# Patient Record
Sex: Female | Born: 1960 | Race: Black or African American | Hispanic: No | Marital: Single | Smoking: Current every day smoker
Health system: Southern US, Community
[De-identification: ages and names within clinical notes are randomized; demographics above are authoritative.]

## PROBLEM LIST (undated history)

## (undated) DIAGNOSIS — E785 Hyperlipidemia, unspecified: Secondary | ICD-10-CM

## (undated) DIAGNOSIS — I10 Essential (primary) hypertension: Secondary | ICD-10-CM

## (undated) DIAGNOSIS — E119 Type 2 diabetes mellitus without complications: Secondary | ICD-10-CM

## (undated) DIAGNOSIS — F419 Anxiety disorder, unspecified: Secondary | ICD-10-CM

## (undated) DIAGNOSIS — E079 Disorder of thyroid, unspecified: Secondary | ICD-10-CM

## (undated) HISTORY — DX: Type 2 diabetes mellitus without complications: E11.9

## (undated) HISTORY — PX: CARPAL TUNNEL RELEASE: SHX101

## (undated) HISTORY — DX: Hyperlipidemia, unspecified: E78.5

## (undated) HISTORY — PX: ROTATOR CUFF REPAIR: SHX139

## (undated) HISTORY — PX: ABDOMINAL HYSTERECTOMY: SHX81

---

## 2006-03-28 ENCOUNTER — Emergency Department (HOSPITAL_COMMUNITY): Admission: EM | Admit: 2006-03-28 | Discharge: 2006-03-29 | Payer: Self-pay | Admitting: Emergency Medicine

## 2013-02-16 ENCOUNTER — Emergency Department (HOSPITAL_BASED_OUTPATIENT_CLINIC_OR_DEPARTMENT_OTHER): Payer: Self-pay

## 2013-02-16 ENCOUNTER — Emergency Department (HOSPITAL_BASED_OUTPATIENT_CLINIC_OR_DEPARTMENT_OTHER)
Admission: EM | Admit: 2013-02-16 | Discharge: 2013-02-17 | Disposition: A | Discharge status: Home / Self Care | Payer: Self-pay | Attending: Emergency Medicine | Admitting: Emergency Medicine

## 2013-02-16 ENCOUNTER — Encounter (HOSPITAL_BASED_OUTPATIENT_CLINIC_OR_DEPARTMENT_OTHER): Payer: Self-pay | Admitting: Emergency Medicine

## 2013-02-16 DIAGNOSIS — Z79899 Other long term (current) drug therapy: Secondary | ICD-10-CM | POA: Insufficient documentation

## 2013-02-16 DIAGNOSIS — E079 Disorder of thyroid, unspecified: Secondary | ICD-10-CM | POA: Insufficient documentation

## 2013-02-16 DIAGNOSIS — I10 Essential (primary) hypertension: Secondary | ICD-10-CM | POA: Insufficient documentation

## 2013-02-16 DIAGNOSIS — F411 Generalized anxiety disorder: Secondary | ICD-10-CM | POA: Insufficient documentation

## 2013-02-16 DIAGNOSIS — J40 Bronchitis, not specified as acute or chronic: Secondary | ICD-10-CM

## 2013-02-16 DIAGNOSIS — J209 Acute bronchitis, unspecified: Secondary | ICD-10-CM | POA: Insufficient documentation

## 2013-02-16 HISTORY — DX: Anxiety disorder, unspecified: F41.9

## 2013-02-16 HISTORY — DX: Essential (primary) hypertension: I10

## 2013-02-16 HISTORY — DX: Disorder of thyroid, unspecified: E07.9

## 2013-02-16 NOTE — ED Notes (Signed)
Pt reports cough x 1 week and SOB. Seen by PMD for same last tues given PCN and taking OTC cough med SOB increased today along with continued cough

## 2013-02-16 NOTE — ED Provider Notes (Signed)
CSN: [id number]     Arrival date & time 02/16/13  2046 History   First MD Initiated Contact with Patient 02/16/13 2220     Chief Complaint  Patient presents with  . Cough  . Shortness of Breath   (Consider location/radiation/quality/duration/timing/severity/associated sxs/prior Treatment) Patient is a 52 y.o. female presenting with cough. The history is provided by the patient. No language interpreter was used.  Cough Cough characteristics:  Productive Sputum characteristics:  Nondescript Severity:  Moderate Onset quality:  Gradual Duration:  1 week Timing:  Constant Progression:  Worsening Chronicity:  New Smoker: no   Relieved by:  Nothing Worsened by:  Nothing tried Ineffective treatments:  None tried Associated symptoms: shortness of breath   Associated symptoms: no chest pain and no fever   Pt complains of a cough and congestion.  Pt reports coughing up colored phelgm  Past Medical History  Diagnosis Date  . Hypertension   . Thyroid disease   . Anxiety    No past surgical history on file. No family history on file. History  Substance Use Topics  . Smoking status: Never Smoker   . Smokeless tobacco: Not on file  . Alcohol Use: No   OB History   Grav Para Term Preterm Abortions TAB SAB Ect Mult Living                 Review of Systems  Constitutional: Negative for fever.  Respiratory: Positive for cough and shortness of breath.   Cardiovascular: Negative for chest pain.  All other systems reviewed and are negative.    Allergies  Review of patient's allergies indicates no known allergies.  Home Medications   Current Outpatient Rx  Name  Route  Sig  Dispense  Refill  . alprazolam (XANAX) 2 MG tablet   Oral   Take 2 mg by mouth at bedtime as needed for sleep.         Marland Kitchen levothyroxine (SYNTHROID, LEVOTHROID) 75 MCG tablet   Oral   Take 75 mcg by mouth daily before breakfast.         . lisinopril (PRINIVIL,ZESTRIL) 10 MG tablet   Oral   Take 10  mg by mouth daily.          BP 156/85  Pulse 91  Temp(Src) 98.7 F (37.1 C) (Oral)  Resp 18  SpO2 97% Physical Exam  Nursing note and vitals reviewed. Constitutional: She appears well-developed and well-nourished.  HENT:  Head: Normocephalic.  Right Ear: External ear normal.  Left Ear: External ear normal.  Eyes: Conjunctivae are normal. Pupils are equal, round, and reactive to light.  Neck: Normal range of motion. Neck supple.  Cardiovascular: Normal rate and normal heart sounds.   Pulmonary/Chest: Effort normal.  Abdominal: Soft. Bowel sounds are normal.  Musculoskeletal: Normal range of motion.  Neurological: She is alert.  Skin: Skin is warm.  Psychiatric: She has a normal mood and affect.    ED Course  Procedures (including critical care time) Labs Review Labs Reviewed - No data to display Imaging Review Dg Chest 2 View  02/16/2013   CLINICAL DATA:  Cough, shortness of breath.  EXAM: CHEST  2 VIEW  COMPARISON:  None.  FINDINGS: The heart size and mediastinal contours are within normal limits. Both lungs are clear. The visualized skeletal structures are unremarkable.  IMPRESSION: No active cardiopulmonary disease.   Electronically Signed   By: Charlett Nose M.D.   On: 02/16/2013 23:54    MDM   1. Bronchitis  Rx for zithromax and tussionex.   Pt advised to see her Md for recheck in 3-4 days  Elson Areas, [place] 02/17/13 0011

## 2013-02-17 MED ORDER — AZITHROMYCIN 250 MG PO TABS: 250.0000 mg | ORAL_TABLET | Freq: Every day | ORAL | Status: DC

## 2013-02-17 MED ORDER — HYDROCODONE-HOMATROPINE 5-1.5 MG/5ML PO SYRP: 5.0000 mL | ORAL_SOLUTION | Freq: Four times a day (QID) | ORAL | Status: DC | PRN

## 2013-02-17 NOTE — ED Provider Notes (Signed)
Medical screening examination/treatment/procedure(s) were performed by non-physician practitioner and as supervising physician I was immediately available for consultation/collaboration.   Dagmar Hait, MD 02/17/13 1725

## 2013-10-20 ENCOUNTER — Emergency Department (HOSPITAL_BASED_OUTPATIENT_CLINIC_OR_DEPARTMENT_OTHER): Payer: Self-pay

## 2013-10-20 ENCOUNTER — Encounter (HOSPITAL_BASED_OUTPATIENT_CLINIC_OR_DEPARTMENT_OTHER): Payer: Self-pay | Admitting: Emergency Medicine

## 2013-10-20 ENCOUNTER — Emergency Department (HOSPITAL_BASED_OUTPATIENT_CLINIC_OR_DEPARTMENT_OTHER)
Admission: EM | Admit: 2013-10-20 | Discharge: 2013-10-20 | Disposition: A | Discharge status: Home / Self Care | Payer: Self-pay | Attending: Emergency Medicine | Admitting: Emergency Medicine

## 2013-10-20 DIAGNOSIS — R059 Cough, unspecified: Secondary | ICD-10-CM | POA: Insufficient documentation

## 2013-10-20 DIAGNOSIS — R109 Unspecified abdominal pain: Secondary | ICD-10-CM | POA: Insufficient documentation

## 2013-10-20 DIAGNOSIS — R52 Pain, unspecified: Secondary | ICD-10-CM | POA: Insufficient documentation

## 2013-10-20 DIAGNOSIS — R5383 Other fatigue: Secondary | ICD-10-CM

## 2013-10-20 DIAGNOSIS — Z79899 Other long term (current) drug therapy: Secondary | ICD-10-CM | POA: Insufficient documentation

## 2013-10-20 DIAGNOSIS — R5381 Other malaise: Secondary | ICD-10-CM | POA: Insufficient documentation

## 2013-10-20 DIAGNOSIS — J3489 Other specified disorders of nose and nasal sinuses: Secondary | ICD-10-CM | POA: Insufficient documentation

## 2013-10-20 DIAGNOSIS — E079 Disorder of thyroid, unspecified: Secondary | ICD-10-CM | POA: Insufficient documentation

## 2013-10-20 DIAGNOSIS — I10 Essential (primary) hypertension: Secondary | ICD-10-CM | POA: Insufficient documentation

## 2013-10-20 DIAGNOSIS — Z792 Long term (current) use of antibiotics: Secondary | ICD-10-CM | POA: Insufficient documentation

## 2013-10-20 DIAGNOSIS — R51 Headache: Secondary | ICD-10-CM | POA: Insufficient documentation

## 2013-10-20 DIAGNOSIS — R519 Headache, unspecified: Secondary | ICD-10-CM

## 2013-10-20 DIAGNOSIS — R002 Palpitations: Secondary | ICD-10-CM | POA: Insufficient documentation

## 2013-10-20 DIAGNOSIS — R05 Cough: Secondary | ICD-10-CM | POA: Insufficient documentation

## 2013-10-20 DIAGNOSIS — F411 Generalized anxiety disorder: Secondary | ICD-10-CM | POA: Insufficient documentation

## 2013-10-20 LAB — COMPREHENSIVE METABOLIC PANEL
ALT: 29 U/L (ref 0–35)
AST: 23 U/L (ref 0–37)
Albumin: 4.1 g/dL (ref 3.5–5.2)
Alkaline Phosphatase: 61 U/L (ref 39–117)
BUN: 15 mg/dL (ref 6–23)
CO2: 32 mEq/L (ref 19–32)
Calcium: 10.3 mg/dL (ref 8.4–10.5)
Chloride: 97 mEq/L (ref 96–112)
Creatinine, Ser: 0.8 mg/dL (ref 0.50–1.10)
GFR calc Af Amer: >90 mL/min (ref >90)
GFR calc non Af Amer: 85 mL/min — ABNORMAL LOW (ref >90)
Glucose, Bld: 103 mg/dL — ABNORMAL HIGH (ref 70–99)
Potassium: 3.3 mEq/L — ABNORMAL LOW (ref 3.7–5.3)
Sodium: 142 mEq/L (ref 137–147)
Total Bilirubin: 0.3 mg/dL (ref 0.3–1.2)
Total Protein: 7.5 g/dL (ref 6.0–8.3)

## 2013-10-20 LAB — URINALYSIS, ROUTINE W REFLEX MICROSCOPIC
Bilirubin Urine: NEGATIVE
GLUCOSE, UA: NEGATIVE mg/dL
KETONES UR: NEGATIVE mg/dL
LEUKOCYTES UA: NEGATIVE
Nitrite: NEGATIVE
PROTEIN: NEGATIVE mg/dL
Specific Gravity, Urine: 1.025 (ref 1.005–1.030)
Urobilinogen, UA: 1 mg/dL (ref 0.0–1.0)
pH: 6 (ref 5.0–8.0)

## 2013-10-20 LAB — CBC WITH DIFFERENTIAL/PLATELET
Basophils Absolute: 0 10*3/uL (ref 0.0–0.1)
Basophils Relative: 1 % (ref 0–1)
Eosinophils Absolute: 0.1 10*3/uL (ref 0.0–0.7)
Eosinophils Relative: 1 % (ref 0–5)
HCT: 41.4 % (ref 36.0–46.0)
Hemoglobin: 14.3 g/dL (ref 12.0–15.0)
Lymphocytes Relative: 48 % — ABNORMAL HIGH (ref 12–46)
Lymphs Abs: 2.9 10*3/uL (ref 0.7–4.0)
MCH: 31.9 pg (ref 26.0–34.0)
MCHC: 34.5 g/dL (ref 30.0–36.0)
MCV: 92.4 fL (ref 78.0–100.0)
Monocytes Absolute: 0.5 10*3/uL (ref 0.1–1.0)
Monocytes Relative: 8 % (ref 3–12)
Neutro Abs: 2.5 10*3/uL (ref 1.7–7.7)
Neutrophils Relative %: 42 % — ABNORMAL LOW (ref 43–77)
Platelets: 221 10*3/uL (ref 150–400)
RBC: 4.48 MIL/uL (ref 3.87–5.11)
RDW: 12.8 % (ref 11.5–15.5)
WBC: 6 10*3/uL (ref 4.0–10.5)

## 2013-10-20 LAB — TROPONIN I: Troponin I: <0.3 ng/mL (ref <0.30)

## 2013-10-20 LAB — URINE MICROSCOPIC-ADD ON

## 2013-10-20 LAB — I-STAT CG4 LACTIC ACID, ED: Lactic Acid, Venous: 1.03 mmol/L (ref 0.5–2.2)

## 2013-10-20 LAB — LIPASE, BLOOD: Lipase: 22 U/L (ref 11–59)

## 2013-10-20 LAB — TSH: TSH: 0.02 u[IU]/mL — ABNORMAL LOW (ref 0.350–4.500)

## 2013-10-20 MED ORDER — OXYCODONE-ACETAMINOPHEN 5-325 MG PO TABS: 2.0000 | ORAL_TABLET | ORAL | Status: DC | PRN

## 2013-10-20 MED ORDER — METOCLOPRAMIDE HCL 5 MG/ML IJ SOLN
10.0000 mg | Freq: Once | INTRAMUSCULAR | Status: AC
  Administered 2013-10-20: 10 mg via INTRAVENOUS
  Filled 2013-10-20: qty 2

## 2013-10-20 MED ORDER — KETOROLAC TROMETHAMINE 30 MG/ML IJ SOLN
30.0000 mg | Freq: Once | INTRAMUSCULAR | Status: AC
  Administered 2013-10-20: 30 mg via INTRAVENOUS
  Filled 2013-10-20: qty 1

## 2013-10-20 MED ORDER — HYDROMORPHONE HCL PF 1 MG/ML IJ SOLN
1.0000 mg | Freq: Once | INTRAMUSCULAR | Status: AC
  Administered 2013-10-20: 1 mg via INTRAVENOUS
  Filled 2013-10-20: qty 1

## 2013-10-20 MED ORDER — ONDANSETRON 8 MG PO TBDP: ORAL_TABLET | ORAL | Status: DC

## 2013-10-20 MED ORDER — METOCLOPRAMIDE HCL 10 MG PO TABS: 10.0000 mg | ORAL_TABLET | Freq: Four times a day (QID) | ORAL | Status: DC | PRN

## 2013-10-20 MED ORDER — DIPHENHYDRAMINE HCL 50 MG/ML IJ SOLN
25.0000 mg | Freq: Once | INTRAMUSCULAR | Status: AC
  Administered 2013-10-20: 25 mg via INTRAVENOUS
  Filled 2013-10-20: qty 1

## 2013-10-20 NOTE — ED Provider Notes (Signed)
CSN: [id number]     Arrival date & time 10/20/13  1514 History   First MD Initiated Contact with Patient 10/20/13 1525     Chief Complaint  Patient presents with  . Headache  . Palpitations     (Consider location/radiation/quality/duration/timing/severity/associated sxs/prior Treatment) HPI One week cough nasal congestion body aches general fatigue and daily spells mild pressure headache gradual onset lasts several hours [place] cold helps; no neuro Sxs; feels generally weak fatigue last week, twice daily cold sweats for 15 minutes each, palpitations twice first lasted several hours felt like racing 5 days ago did not check pulse, occurred at rest; 4 days ago had similar spell; still has headaches daily so to ED since keeps recurring. Thyroid checked OK 2 months ago. 2 days ago noticed mild lower abdominal pain only when cough. No N/V/D dysuria. No CP/SOB.  Past Medical History  Diagnosis Date  . Hypertension   . Thyroid disease   . Anxiety    Past Surgical History  Procedure Laterality Date  . Abdominal hysterectomy     No family history on file. History  Substance Use Topics  . Smoking status: Never Smoker   . Smokeless tobacco: Never Used  . Alcohol Use: No   OB History   Grav Para Term Preterm Abortions TAB SAB Ect Mult Living                 Review of Systems 10 Systems reviewed and are negative for acute change except as noted in the HPI.   Allergies  Review of patient's allergies indicates no known allergies.  Home Medications   Prior to Admission medications   Medication Sig Start Date End Date Taking? Authorizing Provider  alprazolam Prudy Feeler(XANAX) 2 MG tablet Take 2 mg by mouth at bedtime as needed for sleep.   Yes Historical Provider, MD  levothyroxine (SYNTHROID, LEVOTHROID) 75 MCG tablet Take 75 mcg by mouth daily before breakfast.   Yes Historical Provider, MD  lisinopril (PRINIVIL,ZESTRIL) 10 MG tablet Take 10 mg by mouth daily.   Yes Historical Provider,  MD  azithromycin (ZITHROMAX) 250 MG tablet Take 1 tablet (250 mg total) by mouth daily. Take first 2 tablets together, then 1 every day until finished. 02/17/13   Elson AreasLeslie K Sofia, PA-C  HYDROcodone-homatropine [facility](HYCODAN) 5-1.5 MG/5ML syrup Take 5 mLs by mouth every 6 (six) hours as needed for cough. 02/17/13   Elson AreasLeslie K Sofia, PA-C   BP 105/51  Pulse 70  Temp(Src) 98.8 F (37.1 C) (Oral)  Resp 13  SpO2 98% Physical Exam  Nursing note and vitals reviewed. Constitutional:  Awake, alert, nontoxic appearance with baseline speech for patient.  HENT:  Head: Atraumatic.  Mouth/Throat: No oropharyngeal exudate.  Eyes: EOM are normal. Pupils are equal, round, and reactive to light. Right eye exhibits no discharge. Left eye exhibits no discharge.  Neck: Neck supple.  Cardiovascular: Normal rate and regular rhythm.   No murmur heard. Pulmonary/Chest: Effort normal and breath sounds normal. No stridor. No respiratory distress. She has no wheezes. She has no rales. She exhibits no tenderness.  Abdominal: Soft. Bowel sounds are normal. She exhibits no mass. There is tenderness. There is no rebound.  Minimal suprapubic tenderness  Musculoskeletal: She exhibits no tenderness.  Baseline ROM, moves extremities with no obvious new focal weakness.  Lymphadenopathy:    She has no cervical adenopathy.  Neurological: She is alert.  Awake, alert, cooperative and aware of situation; motor strength bilaterally; sensation normal to light touch bilaterally; peripheral visual  fields full to confrontation; no facial asymmetry; tongue midline; major cranial nerves appear intact; no pronator drift, normal finger to nose bilaterally, baseline gait without new ataxia.  Skin: No rash noted.  Psychiatric: She has a normal mood and affect.    ED Course  Procedures (including critical care time) Patient / Family / Caregiver informed of clinical course, understand medical decision-making process, and agree with plan.Suspect  viral syndrome. Labs Review Labs Reviewed  CBC WITH DIFFERENTIAL - Abnormal; Notable for the following:    Neutrophils Relative % 42 (*)    Lymphocytes Relative 48 (*)    All other components within normal limits  COMPREHENSIVE METABOLIC PANEL - Abnormal; Notable for the following:    Potassium 3.3 (*)    Glucose, Bld 103 (*)    GFR calc non Af Amer 85 (*)    All other components within normal limits  URINALYSIS, ROUTINE W REFLEX MICROSCOPIC - Abnormal; Notable for the following:    Hgb urine dipstick SMALL (*)    All other components within normal limits  TSH - Abnormal; Notable for the following:    TSH 0.020 (*)    All other components within normal limits  URINE MICROSCOPIC-ADD ON - Abnormal; Notable for the following:    Squamous Epithelial / LPF FEW (*)    Bacteria, UA FEW (*)    All other components within normal limits  LIPASE, BLOOD  TROPONIN I  I-STAT CG4 LACTIC ACID, ED    Imaging Review Dg Chest 2 View  10/20/2013   CLINICAL DATA:  Palpitations.  EXAM: CHEST  2 VIEW  COMPARISON:  None.  FINDINGS: The heart size and mediastinal contours are within normal limits. Both lungs are clear. The visualized skeletal structures are unremarkable.  IMPRESSION: No active cardiopulmonary disease.   Electronically Signed   By: Signa Kellaylor  Stroud M.D.   On: 10/20/2013 16:13     EKG Interpretation   Date/Time:  Sunday Oct 20 2013 15:27:04 EDT Ventricular Rate:  96 PR Interval:  146 QRS Duration: 84 QT Interval:  436 QTC Calculation: 550 R Axis:   -71 Text Interpretation:  Normal sinus rhythm Left axis deviation Possible  Anterolateral infarct , age undetermined No previous ECGs available  Confirmed by [facility] Ltd[facility]EDNAR  MD, Jonny RuizJOHN ([id number]) on 10/20/2013 3:43:35 PM      MDM   Final diagnoses:  Headache  Palpitations    I doubt any other EMC precluding discharge at this time including, but not necessarily limited to the following:sepsis, AMI, SAH, CVA, meningitis.    Hurman HornJohn M Kennett Symes,  MD 10/22/13 [phone]

## 2013-10-20 NOTE — ED Notes (Signed)
Reports headache since Monday and palpitations since wednesday

## 2013-10-20 NOTE — Discharge Instructions (Signed)
You are having a headache. No specific cause was found today for your headache. It may have been a migraine or other cause of headache. Stress, anxiety, fatigue, and depression are common triggers for headaches. Your headache today does not appear to be life-threatening or require hospitalization, but often the exact cause of headaches is not determined in the emergency department. Therefore, follow-up with your doctor is very important to find out what may have caused your headache, and whether or not you need any further diagnostic testing or treatment. Sometimes headaches can appear benign (not harmful), but then more serious symptoms can develop which should prompt an immediate re-evaluation by your doctor or the emergency department. °SEEK MEDICAL ATTENTION IF: °You develop possible problems with medications prescribed.  °The medications don't resolve your headache, if it recurs , or if you have multiple episodes of vomiting or can't take fluids. °You have a change from the usual headache. °RETURN IMMEDIATELY IF you develop a sudden, severe headache or confusion, become poorly responsive or faint, develop a fever above 100.4F or problem breathing, have a change in speech, vision, swallowing, or understanding, or develop new weakness, numbness, tingling, incoordination, or have a seizure. ° °Not every illness or injury can be identified during an emergency department visit, thus follow-up with your primary healthcare provider is important. Medical conditions can also worsen, so it is also important to return immediately as directed below, or if you have other serious concerns develop. °RETURN IMMEDIATELY IF you develop new shortness of breath, chest pain, fever, have difficulty moving parts of your body (new weakness, numbness, or incoordination), sudden change in speech, vision, swallowing, or understanding, faint or develop new dizziness, severe headache, become poorly responsive or have an altered mental  status compared to baseline for you, new rash, abdominal pain, or bloody stools,  °Return sooner also if you develop new problems for which you have not talked to your caregiver but you feel may be emergency medical conditions, or are unable to be cared for safely at home. °

## 2014-07-19 ENCOUNTER — Emergency Department (HOSPITAL_BASED_OUTPATIENT_CLINIC_OR_DEPARTMENT_OTHER): Payer: Self-pay

## 2014-07-19 ENCOUNTER — Encounter (HOSPITAL_BASED_OUTPATIENT_CLINIC_OR_DEPARTMENT_OTHER): Payer: Self-pay | Admitting: *Deleted

## 2014-07-19 ENCOUNTER — Emergency Department (HOSPITAL_BASED_OUTPATIENT_CLINIC_OR_DEPARTMENT_OTHER)
Admission: EM | Admit: 2014-07-19 | Discharge: 2014-07-19 | Disposition: A | Discharge status: Home / Self Care | Payer: Self-pay | Attending: Emergency Medicine | Admitting: Emergency Medicine

## 2014-07-19 DIAGNOSIS — T464X5A Adverse effect of angiotensin-converting-enzyme inhibitors, initial encounter: Secondary | ICD-10-CM | POA: Insufficient documentation

## 2014-07-19 DIAGNOSIS — Z79899 Other long term (current) drug therapy: Secondary | ICD-10-CM | POA: Insufficient documentation

## 2014-07-19 DIAGNOSIS — I10 Essential (primary) hypertension: Secondary | ICD-10-CM | POA: Insufficient documentation

## 2014-07-19 DIAGNOSIS — F419 Anxiety disorder, unspecified: Secondary | ICD-10-CM | POA: Insufficient documentation

## 2014-07-19 DIAGNOSIS — Z792 Long term (current) use of antibiotics: Secondary | ICD-10-CM | POA: Insufficient documentation

## 2014-07-19 DIAGNOSIS — E079 Disorder of thyroid, unspecified: Secondary | ICD-10-CM | POA: Insufficient documentation

## 2014-07-19 DIAGNOSIS — R05 Cough: Secondary | ICD-10-CM | POA: Insufficient documentation

## 2014-07-19 DIAGNOSIS — R059 Cough, unspecified: Secondary | ICD-10-CM

## 2014-07-19 MED ORDER — BENZONATATE 100 MG PO CAPS: 100.0000 mg | ORAL_CAPSULE | Freq: Three times a day (TID) | ORAL | Status: DC

## 2014-07-19 NOTE — ED Provider Notes (Signed)
CSN: [id number]     Arrival date & time 07/19/14  1421 History   First MD Initiated Contact with Patient 07/19/14 1422     Chief Complaint  Patient presents with  . Cough     (Consider location/radiation/quality/duration/timing/severity/associated sxs/prior Treatment) HPI Comments: 54 year old female with high blood pressure and thyroid history presents with recurrent cough and mild congestion nonproductive for the past month. Patient initially denied taking lisinopril however it's on her home medications. Patient said no fevers or chills no heart failure history mild worsening at night. No significant weight gain or leg swelling. No reflux history known.   Patient is a 54 y.o. female presenting with cough. The history is provided by the patient.  Cough Associated symptoms: no chest pain, no chills, no fever, no headaches, no rash and no shortness of breath     Past Medical History  Diagnosis Date  . Hypertension   . Thyroid disease   . Anxiety    Past Surgical History  Procedure Laterality Date  . Abdominal hysterectomy     No family history on file. History  Substance Use Topics  . Smoking status: Passive Smoke Exposure - Never Smoker  . Smokeless tobacco: Never Used  . Alcohol Use: No   OB History    No data available     Review of Systems  Constitutional: Negative for fever and chills.  HENT: Negative for congestion.   Eyes: Negative for visual disturbance.  Respiratory: Positive for cough. Negative for shortness of breath.   Cardiovascular: Negative for chest pain.  Gastrointestinal: Negative for vomiting and abdominal pain.  Genitourinary: Negative for dysuria and flank pain.  Musculoskeletal: Negative for back pain, neck pain and neck stiffness.  Skin: Negative for rash.  Neurological: Negative for light-headedness and headaches.      Allergies  Review of patient's allergies indicates no known allergies.  Home Medications   Prior to Admission  medications   Medication Sig Start Date End Date Taking? Authorizing Provider  alprazolam Prudy Feeler(XANAX) 2 MG tablet Take 2 mg by mouth at bedtime as needed for sleep.   Yes Historical Provider, MD  levothyroxine (SYNTHROID, LEVOTHROID) 75 MCG tablet Take 75 mcg by mouth daily before breakfast.   Yes Historical Provider, MD  lisinopril (PRINIVIL,ZESTRIL) 10 MG tablet Take 10 mg by mouth daily.   Yes Historical Provider, MD  azithromycin (ZITHROMAX) 250 MG tablet Take 1 tablet (250 mg total) by mouth daily. Take first 2 tablets together, then 1 every day until finished. 02/17/13   Elson AreasLeslie K Sofia, PA-C  HYDROcodone-homatropine [facility](HYCODAN) 5-1.5 MG/5ML syrup Take 5 mLs by mouth every 6 (six) hours as needed for cough. 02/17/13   Elson AreasLeslie K Sofia, PA-C  metoCLOPramide (REGLAN) 10 MG tablet Take 1 tablet (10 mg total) by mouth every 6 (six) hours as needed for nausea (nausea/headache). 10/20/13   Hurman HornJohn M Bednar, MD  ondansetron (ZOFRAN ODT) 8 MG disintegrating tablet 8mg  ODT q4 hours prn nausea 10/20/13   Hurman HornJohn M Bednar, MD  oxyCODONE-acetaminophen (PERCOCET) 5-325 MG per tablet Take 2 tablets by mouth every 4 (four) hours as needed. 10/20/13   Hurman HornJohn M Bednar, MD   BP 139/93 mmHg  Pulse 89  Temp(Src) 99.2 F (37.3 C) (Oral)  Resp 20  Ht 5\' 4"  (1.626 m)  SpO2 100% Physical Exam  Constitutional: She is oriented to person, place, and time. She appears well-developed and well-nourished.  HENT:  Head: Normocephalic and atraumatic.  Eyes: Conjunctivae are normal. Right eye exhibits no discharge. Left  eye exhibits no discharge.  Neck: Normal range of motion. Neck supple. No tracheal deviation present.  Cardiovascular: Normal rate and regular rhythm.   Pulmonary/Chest: Effort normal and breath sounds normal.  Musculoskeletal: She exhibits no edema.  Neurological: She is alert and oriented to person, place, and time.  Skin: Skin is warm. No rash noted.  Psychiatric: She has a normal mood and affect.  Nursing note and  vitals reviewed.   ED Course  Procedures (including critical care time) Labs Review Labs Reviewed - No data to display  Imaging Review No results found.   EKG Interpretation None      MDM   Final diagnoses:  Cough   Patient presents with recurrent cough likely lisinopril-induced however may be viral with the seasoning congestion. Discussed holding lisinopril, honey for cough and follow-up outpatient. No angioedema on exam. Signed out to fup Xray. Results and differential diagnosis were discussed with the patient/parent/guardian. Close follow up outpatient was discussed, comfortable with the plan.   Medications - No data to display  Filed Vitals:   07/19/14 1428  BP: 139/93  Pulse: 89  Temp: 99.2 F (37.3 C)  TempSrc: Oral  Resp: 20  Height:  (1.626 m)  SpO2: 100%    Final diagnoses:  Cough       Enid Skeens, MD 07/19/14 1515

## 2014-07-19 NOTE — ED Notes (Signed)
Pt reports cough over 1 month- congested but non-productive

## 2014-07-19 NOTE — Discharge Instructions (Signed)
Stop taking and avoid lisinopril as it side effect is chronic cough. Check your Blood Pressures at home daily, and discuss with your doctor.  Discuss other medications with hyourdoctor. Try tea and honey for your cough.   If you were given medicines take as directed.  If you are on coumadin or contraceptives realize their levels and effectiveness is altered by many different medicines.  If you have any reaction (rash, tongues swelling, other) to the medicines stop taking and see a physician.   Please follow up as directed and return to the ER or see a physician for new or worsening symptoms.  Thank you. Filed Vitals:   07/19/14 1428  BP: 139/93  Pulse: 89  Temp: 99.2 F (37.3 C)  TempSrc: Oral  Resp: 20  Height: 5\' 4"  (1.626 m)  SpO2: 100%

## 2014-08-03 ENCOUNTER — Emergency Department (HOSPITAL_BASED_OUTPATIENT_CLINIC_OR_DEPARTMENT_OTHER)
Admission: EM | Admit: 2014-08-03 | Discharge: 2014-08-03 | Disposition: A | Discharge status: Home / Self Care | Payer: Self-pay | Attending: Emergency Medicine | Admitting: Emergency Medicine

## 2014-08-03 ENCOUNTER — Encounter (HOSPITAL_BASED_OUTPATIENT_CLINIC_OR_DEPARTMENT_OTHER): Payer: Self-pay

## 2014-08-03 DIAGNOSIS — F32A Depression, unspecified: Secondary | ICD-10-CM

## 2014-08-03 DIAGNOSIS — E079 Disorder of thyroid, unspecified: Secondary | ICD-10-CM | POA: Insufficient documentation

## 2014-08-03 DIAGNOSIS — Z79899 Other long term (current) drug therapy: Secondary | ICD-10-CM | POA: Insufficient documentation

## 2014-08-03 DIAGNOSIS — F329 Major depressive disorder, single episode, unspecified: Secondary | ICD-10-CM | POA: Insufficient documentation

## 2014-08-03 DIAGNOSIS — F419 Anxiety disorder, unspecified: Secondary | ICD-10-CM | POA: Insufficient documentation

## 2014-08-03 DIAGNOSIS — Z792 Long term (current) use of antibiotics: Secondary | ICD-10-CM | POA: Insufficient documentation

## 2014-08-03 DIAGNOSIS — I10 Essential (primary) hypertension: Secondary | ICD-10-CM | POA: Insufficient documentation

## 2014-08-03 LAB — CBC WITH DIFFERENTIAL/PLATELET
Basophils Absolute: 0 10*3/uL (ref 0.0–0.1)
Basophils Relative: 0 % (ref 0–1)
Eosinophils Absolute: 0 10*3/uL (ref 0.0–0.7)
Eosinophils Relative: 0 % (ref 0–5)
HCT: 43.4 % (ref 36.0–46.0)
Hemoglobin: 14.4 g/dL (ref 12.0–15.0)
Lymphocytes Relative: 33 % (ref 12–46)
Lymphs Abs: 3 10*3/uL (ref 0.7–4.0)
MCH: 30.9 pg (ref 26.0–34.0)
MCHC: 33.2 g/dL (ref 30.0–36.0)
MCV: 93.1 fL (ref 78.0–100.0)
Monocytes Absolute: 0.6 10*3/uL (ref 0.1–1.0)
Monocytes Relative: 7 % (ref 3–12)
Neutro Abs: 5.5 10*3/uL (ref 1.7–7.7)
Neutrophils Relative %: 60 % (ref 43–77)
Platelets: 253 10*3/uL (ref 150–400)
RBC: 4.66 MIL/uL (ref 3.87–5.11)
RDW: 13.7 % (ref 11.5–15.5)
WBC: 9.2 10*3/uL (ref 4.0–10.5)

## 2014-08-03 LAB — URINALYSIS, ROUTINE W REFLEX MICROSCOPIC
Bilirubin Urine: NEGATIVE
GLUCOSE, UA: NEGATIVE mg/dL
KETONES UR: NEGATIVE mg/dL
Leukocytes, UA: NEGATIVE
Nitrite: NEGATIVE
PH: 5 (ref 5.0–8.0)
Protein, ur: NEGATIVE mg/dL
Specific Gravity, Urine: 1.008 (ref 1.005–1.030)
Urobilinogen, UA: 0.2 mg/dL (ref 0.0–1.0)

## 2014-08-03 LAB — RAPID URINE DRUG SCREEN, HOSP PERFORMED
AMPHETAMINES: NOT DETECTED
BARBITURATES: NOT DETECTED
BENZODIAZEPINES: POSITIVE — AB
COCAINE: NOT DETECTED
OPIATES: NOT DETECTED
Tetrahydrocannabinol: NOT DETECTED

## 2014-08-03 LAB — BASIC METABOLIC PANEL
Anion gap: 3 — ABNORMAL LOW (ref 5–15)
BUN: 18 mg/dL (ref 6–23)
CHLORIDE: 109 mmol/L (ref 96–112)
CO2: 24 mmol/L (ref 19–32)
Calcium: 8.9 mg/dL (ref 8.4–10.5)
Creatinine, Ser: 0.9 mg/dL (ref 0.50–1.10)
GFR calc non Af Amer: 73 mL/min — ABNORMAL LOW (ref >90)
GFR, EST AFRICAN AMERICAN: 84 mL/min — AB (ref >90)
Glucose, Bld: 115 mg/dL — ABNORMAL HIGH (ref 70–99)
POTASSIUM: 3.8 mmol/L (ref 3.5–5.1)
SODIUM: 136 mmol/L (ref 135–145)

## 2014-08-03 LAB — URINE MICROSCOPIC-ADD ON

## 2014-08-03 LAB — ETHANOL: Alcohol, Ethyl (B): <5 mg/dL (ref 0–9)

## 2014-08-03 MED ORDER — ALPRAZOLAM 0.5 MG PO TABS
2.0000 mg | ORAL_TABLET | Freq: Once | ORAL | Status: AC
  Administered 2014-08-03: 2 mg via ORAL
  Filled 2014-08-03: qty 4

## 2014-08-03 MED ORDER — ALPRAZOLAM 2 MG PO TABS: 2.0000 mg | ORAL_TABLET | Freq: Every evening | ORAL | Status: DC | PRN

## 2014-08-03 NOTE — [facility] Counselor (Signed)
Spoke to PA about pt disposition and she agreed with recommendations. Counselor sent over outpatient referrals for therapy and pt is to follow up with outpatient doctor for medication adjustment.   Kateri PlummerKristin Arvilla Salada, M.S., LPCA, [place]AS[place], [facility] Licensed Professional Counselor Associate  Triage Specialist  [facility]  Therapeutic Triage Services Phone: [phone] Fax: [phone]

## 2014-08-03 NOTE — ED Provider Notes (Signed)
CSN: [id number]     Arrival date & time 08/03/14  0820 History   First MD Initiated Contact with Patient 08/03/14 [phone]     Chief Complaint  Patient presents with  . Medication Refill     (Consider location/radiation/quality/duration/timing/severity/associated sxs/prior Treatment) HPI Comments: Patient with a history of anxiety and depression presents with depression and suicidal ideations. She states that about 2 weeks ago her primary care physician took her off of her Xanax which she been taking regularly and switch her to Klonopin. She states since she's been on the Klonopin her anxiety has been worse and she's been more depressed. She's having thoughts of wanting to hurt herself. On exam she is tearful and says that she doesn't care if she lives or dies. She denies any specific plan. She feels weak all over and has a little bit of dizziness but denies any other physical complaints. She denies any recent fevers chills. No cough congestion. No chest pain or shortness of breath. No abdominal pain.   Past Medical History  Diagnosis Date  . Hypertension   . Thyroid disease   . Anxiety    Past Surgical History  Procedure Laterality Date  . Abdominal hysterectomy     No family history on file. History  Substance Use Topics  . Smoking status: Passive Smoke Exposure - Never Smoker  . Smokeless tobacco: Never Used  . Alcohol Use: No   OB History    No data available     Review of Systems  Constitutional: Positive for fatigue. Negative for fever, chills and diaphoresis.  HENT: Negative for congestion, rhinorrhea and sneezing.   Eyes: Negative.   Respiratory: Negative for cough, chest tightness and shortness of breath.   Cardiovascular: Negative for chest pain and leg swelling.  Gastrointestinal: Negative for nausea, vomiting, abdominal pain, diarrhea and blood in stool.  Genitourinary: Negative for frequency, hematuria, flank pain and difficulty urinating.  Musculoskeletal: Negative  for back pain and arthralgias.  Skin: Negative for rash.  Neurological: Positive for dizziness and light-headedness. Negative for speech difficulty, weakness, numbness and headaches.  Psychiatric/Behavioral: Positive for suicidal ideas and dysphoric mood. The patient is nervous/anxious.       Allergies  Review of patient's allergies indicates no known allergies.  Home Medications   Prior to Admission medications   Medication Sig Start Date End Date Taking? Authorizing Provider  sertraline (ZOLOFT) 100 MG tablet Take 100 mg by mouth daily.   Yes Historical Provider, MD  alprazolam Prudy Feeler(XANAX) 2 MG tablet Take 1 tablet (2 mg total) by mouth at bedtime as needed for sleep. 08/03/14   Rolan BuccoMelanie Rajiv Parlato, MD  azithromycin (ZITHROMAX) 250 MG tablet Take 1 tablet (250 mg total) by mouth daily. Take first 2 tablets together, then 1 every day until finished. 02/17/13   Elson AreasLeslie K Sofia, PA-C  benzonatate (TESSALON) 100 MG capsule Take 1 capsule (100 mg total) by mouth every 8 (eight) hours. 07/19/14   Rolland PorterMark James, MD  HYDROcodone-homatropine [facility](HYCODAN) 5-1.5 MG/5ML syrup Take 5 mLs by mouth every 6 (six) hours as needed for cough. 02/17/13   Elson AreasLeslie K Sofia, PA-C  levothyroxine (SYNTHROID, LEVOTHROID) 75 MCG tablet Take 75 mcg by mouth daily before breakfast.    Historical Provider, MD  lisinopril (PRINIVIL,ZESTRIL) 10 MG tablet Take 10 mg by mouth daily.    Historical Provider, MD  metoCLOPramide (REGLAN) 10 MG tablet Take 1 tablet (10 mg total) by mouth every 6 (six) hours as needed for nausea (nausea/headache). 10/20/13   Margit BandaJohn M  Bednar, MD  ondansetron (ZOFRAN ODT) 8 MG disintegrating tablet  ODT q4 hours prn nausea 10/20/13   Hurman Horn, MD  oxyCODONE-acetaminophen (PERCOCET) 5-325 MG per tablet Take 2 tablets by mouth every 4 (four) hours as needed. 10/20/13   Hurman Horn, MD   BP 153/107 mmHg  Pulse 95  Temp(Src) 99.1 F (37.3 C) (Oral)  Resp 19  Ht  (1.626 m)  Wt 215 lb (97.523 kg)  BMI 36.89  kg/m2  SpO2 99% Physical Exam  Constitutional: She is oriented to person, place, and time. She appears well-developed and well-nourished.  HENT:  Head: Normocephalic and atraumatic.  Eyes: Pupils are equal, round, and reactive to light.  Neck: Normal range of motion. Neck supple.  Cardiovascular: Normal rate, regular rhythm and normal heart sounds.   Pulmonary/Chest: Effort normal and breath sounds normal. No respiratory distress. She has no wheezes. She has no rales. She exhibits no tenderness.  Abdominal: Soft. Bowel sounds are normal. There is no tenderness. There is no rebound and no guarding.  Musculoskeletal: Normal range of motion. She exhibits no edema.  Lymphadenopathy:    She has no cervical adenopathy.  Neurological: She is alert and oriented to person, place, and time.  Skin: Skin is warm and dry. No rash noted.  Psychiatric: She has a normal mood and affect.    ED Course  Procedures (including critical care time) Labs Review Labs Reviewed  BASIC METABOLIC PANEL - Abnormal; Notable for the following:    Glucose, Bld 115 (*)    GFR calc non Af Amer 73 (*)    GFR calc Af Amer 84 (*)    Anion gap 3 (*)    All other components within normal limits  URINALYSIS, ROUTINE W REFLEX MICROSCOPIC - Abnormal; Notable for the following:    Hgb urine dipstick SMALL (*)    All other components within normal limits  URINE RAPID DRUG SCREEN (HOSP PERFORMED) - Abnormal; Notable for the following:    Benzodiazepines POSITIVE (*)    All other components within normal limits  CBC WITH DIFFERENTIAL/PLATELET  ETHANOL  URINE MICROSCOPIC-ADD ON    Imaging Review No results found.   EKG Interpretation None      MDM   Final diagnoses:  Anxiety  Depression    Patient was assessed by TTS and felt that she was safe for outpatient treatment. She currently is denying any suicidal ideations. I did give her a prescription for a few pills of Xanax which she feels helps her much better  than the Klonopin. She's going follow-up this week with her primary care physician Dr. August Saucer. She also is given list of outpatient resources for behavioral health follow-up.    Rolan Bucco, MD 08/03/14 1236

## 2014-08-03 NOTE — ED Notes (Signed)
Patient here wanting medications switched back. Has been taking daily xanax for years and switched to zoloft the past 10 days. Patient reports that after she read about the side effects of the new medication she thinks she has the reaction with extremity weakness. She wants her xanax to be refilled and to stop the zoloft

## 2014-08-03 NOTE — Discharge Instructions (Signed)

## 2014-08-03 NOTE — ED Notes (Signed)
Patient here requesting that her medication be changed back to xanax. Reports that she was taking 2mg  of xanax and they cut her to 1mg  and now switched to zoloft. Thinks it isnt helping her nerves, reports that she needs her old meds. No other complaints

## 2014-08-03 NOTE — ED Notes (Signed)
telepsych in process. 

## 2014-08-03 NOTE — ED Notes (Addendum)
Dr Fredderick PhenixBelfi speaking to Behavioral health- Pt updated on plan for discharge

## 2014-08-03 NOTE — ED Notes (Signed)
D/c home with family- resources given for f/u outpatient with Behavioral Health - rx x 1 given for xanax- denies SI/HI

## 2014-08-03 NOTE — BH Assessment (Addendum)
Tele Assessment Note   Phyllis HughsBertha Erpelding is an 54 y.o. female who [facility] with issues surrounding recent medication changes. She states she has been taking xanex 2mg  tabs for 20 years and her doctor recently took her off of this and put her on Colonopin. She states that she has had muscle weakness and tingling in her arms and legs, mood swings, and dizziness since she started this medication. She states that she doesn't want to take it anymore and wants to be back on her xanex. She states that she has been having some passive SI thoughts to "just go" but has no plan or intent. She has no prior hospitalizations or prior outpatient therapy history. Pt reports she has been taking prozac for depression. No HI or A/V present, states she just wants to "get her nerve pill and go to church with her grandbaby". Some tearfulness upon assessment but brightened with approach. Also states that she is having a hard time sleeping and only gets 3 hours of sleep a night. No changes in appetite.   Disposition: recommended outpatient follow up with her doctor. Counselor will provide outpatient resources for therapy as well. - per Serena ColonelAggie Nwoko NP   Axis I: 300.02 Generalized Anxiety Disorder Axis II: Deferred Axis III:  Past Medical History  Diagnosis Date  . Hypertension   . Thyroid disease   . Anxiety    Axis IV: other psychosocial or environmental problems Axis V: 51-60 moderate symptoms  Past Medical History:  Past Medical History  Diagnosis Date  . Hypertension   . Thyroid disease   . Anxiety     Past Surgical History  Procedure Laterality Date  . Abdominal hysterectomy      Family History: No family history on file.  Social History:  reports that she has been passively smoking.  She has never used smokeless tobacco. She reports that she does not drink alcohol or use illicit drugs.  Additional Social History:  Alcohol / Drug Use History of alcohol / drug use?: No history of  alcohol / drug abuse  CIWA: CIWA-Ar BP: (!) 145/102 mmHg Pulse Rate: 109 COWS:    PATIENT STRENGTHS: (choose at least two) General fund of knowledge Supportive family/friends  Allergies: No Known Allergies  Home Medications:  (Not in a hospital admission)  OB/GYN Status:  No LMP recorded. Patient has had a hysterectomy.  General Assessment Data Location of Assessment: [facility] Services [facility] Is this a Tele or Face-to-Face Assessment?: Tele Assessment Is this an Initial Assessment or a Re-assessment for this encounter?: Initial Assessment Living Arrangements: Alone Can pt return to current living arrangement?: Yes Admission Status: Voluntary Is patient capable of signing voluntary admission?: Yes Transfer from: Home Referral Source: Self/Family/Friend     [facility] Living Arrangements: Alone Name of Psychiatrist:  (Dr. August Saucerean) Name of Therapist: None  Education Status Is patient currently in school?: No Highest grade of school patient has completed: some colelge  Risk to self with the past 6 months Suicidal Ideation: No (some passive SI thoughts no plan) Suicidal Intent: No Is patient at risk for suicide?: No Suicidal Plan?: No Access to Means: No What has been your use of drugs/alcohol within the last 12 months?:  (None) Previous Attempts/Gestures: No How many times?: 0 Other Self Harm Risks: none Triggers for Past Attempts: None known Intentional Self Injurious Behavior: None Family Suicide History: No Recent stressful life event(s): Other (Comment) (reaction to new medication ) Persecutory voices/beliefs?: No  Depression: Yes Depression Symptoms: Tearfulness, Loss of interest in usual pleasures Substance abuse history and/or treatment for substance abuse?: No Suicide prevention information given to non-admitted patients: Not applicable  Risk to Others within the past 6 months Homicidal Ideation: No Thoughts of Harm to Others: No Current  Homicidal Intent: No Current Homicidal Plan: No Access to Homicidal Means: No Identified Victim:  (None) History of harm to others?: No Assessment of Violence: None Noted Violent Behavior Description: none Does patient have access to weapons?: No Criminal Charges Pending?: No Does patient have a court date: No  Psychosis Hallucinations: None noted Delusions: None noted  Mental Status Report Appear/Hygiene: Unremarkable Eye Contact: Good Motor Activity: Freedom of movement Speech: Logical/coherent Level of Consciousness: Alert Mood: Anxious Affect: Appropriate to circumstance Anxiety Level: Moderate Thought Processes: Coherent Judgement: Unimpaired Orientation: Person, Place, Time, Situation Obsessive Compulsive Thoughts/Behaviors: None  Cognitive Functioning Concentration: Decreased Memory: Recent Intact, Remote Intact IQ: Average Insight: Fair Impulse Control: Good Appetite: Good Weight Loss: 0 Weight Gain: 0 Sleep: Decreased Total Hours of Sleep: 3 Vegetative Symptoms: None  ADLScreening [facility] Assessment Services) Patient's cognitive ability adequate to safely complete daily activities?: Yes Patient able to express need for assistance with ADLs?: Yes Independently performs ADLs?: Yes (appropriate for developmental age)  Prior Inpatient Therapy Prior Inpatient Therapy: No  Prior Outpatient Therapy Prior Outpatient Therapy: No  ADL Screening (condition at time of admission) Patient's cognitive ability adequate to safely complete daily activities?: Yes Is the patient deaf or have difficulty hearing?: No Does the patient have difficulty seeing, even when wearing glasses/contacts?: No Does the patient have difficulty concentrating, remembering, or making decisions?: No Patient able to express need for assistance with ADLs?: Yes Does the patient have difficulty dressing or bathing?: No Independently performs ADLs?: Yes (appropriate for developmental age) Does  the patient have difficulty walking or climbing stairs?: No Weakness of Legs: None Weakness of Arms/Hands: None  Home Assistive Devices/Equipment Home Assistive Devices/Equipment: None    Abuse/Neglect Assessment (Assessment to be complete while patient is alone) Physical Abuse: Denies Verbal Abuse: Denies Sexual Abuse: Denies Exploitation of patient/patient's resources: Denies Self-Neglect: Denies Values / Beliefs Cultural Requests During Hospitalization: None Spiritual Requests During Hospitalization: None Consults Spiritual Care Consult Needed: No Advance Directives (For Healthcare) Does patient have an advance directive?: No Would patient like information on creating an advanced directive?: No - patient declined information    Additional Information 1:1 In Past 12 Months?: No CIRT Risk: No Elopement Risk: No Does patient have medical clearance?: Yes     Disposition:  Disposition Initial Assessment Completed for this Encounter: Yes Disposition of Patient: Other dispositions Other disposition(s): Other (Comment)  Jael Waldorf 08/03/2014 10:36 AM

## 2014-11-11 DIAGNOSIS — M25519 Pain in unspecified shoulder: Secondary | ICD-10-CM | POA: Insufficient documentation

## 2014-12-24 ENCOUNTER — Emergency Department (HOSPITAL_BASED_OUTPATIENT_CLINIC_OR_DEPARTMENT_OTHER): Payer: Self-pay

## 2014-12-24 ENCOUNTER — Encounter (HOSPITAL_BASED_OUTPATIENT_CLINIC_OR_DEPARTMENT_OTHER): Payer: Self-pay | Admitting: *Deleted

## 2014-12-24 ENCOUNTER — Emergency Department (HOSPITAL_BASED_OUTPATIENT_CLINIC_OR_DEPARTMENT_OTHER)
Admission: EM | Admit: 2014-12-24 | Discharge: 2014-12-25 | Disposition: A | Discharge status: Home / Self Care | Payer: Self-pay | Attending: Emergency Medicine | Admitting: Emergency Medicine

## 2014-12-24 DIAGNOSIS — F419 Anxiety disorder, unspecified: Secondary | ICD-10-CM | POA: Insufficient documentation

## 2014-12-24 DIAGNOSIS — Z79899 Other long term (current) drug therapy: Secondary | ICD-10-CM | POA: Insufficient documentation

## 2014-12-24 DIAGNOSIS — M7581 Other shoulder lesions, right shoulder: Secondary | ICD-10-CM | POA: Insufficient documentation

## 2014-12-24 DIAGNOSIS — M779 Enthesopathy, unspecified: Secondary | ICD-10-CM

## 2014-12-24 DIAGNOSIS — E079 Disorder of thyroid, unspecified: Secondary | ICD-10-CM | POA: Insufficient documentation

## 2014-12-24 DIAGNOSIS — R52 Pain, unspecified: Secondary | ICD-10-CM

## 2014-12-24 DIAGNOSIS — I10 Essential (primary) hypertension: Secondary | ICD-10-CM | POA: Insufficient documentation

## 2014-12-24 MED ORDER — METHOCARBAMOL 500 MG PO TABS
1000.0000 mg | ORAL_TABLET | Freq: Three times a day (TID) | ORAL | Status: DC
  Administered 2014-12-24: 1000 mg via ORAL
  Filled 2014-12-24: qty 2

## 2014-12-24 MED ORDER — KETOROLAC TROMETHAMINE 60 MG/2ML IM SOLN
60.0000 mg | Freq: Once | INTRAMUSCULAR | Status: AC
  Administered 2014-12-24: 60 mg via INTRAMUSCULAR
  Filled 2014-12-24: qty 2

## 2014-12-24 NOTE — ED Provider Notes (Signed)
CSN: [id number]     Arrival date & time 12/24/14  2130 History  This chart was scribed for Ameah Chanda, MD by Lyndel SafeKaitlyn Shelton, ED Scribe. This patient was seen in room MH01/MH01 and the patient's care was started 11:26 PM.     Chief Complaint  Patient presents with  . Shoulder Pain    Patient is a 54 y.o. female presenting with shoulder injury. The history is provided by the patient. No language interpreter was used.  Shoulder Injury This is a new problem. The current episode started more than 1 week ago (1 month). The problem occurs constantly. The problem has been gradually worsening. Pertinent negatives include no chest pain and no shortness of breath. Exacerbated by: extension, flexion, adduction, abduction. Nothing relieves the symptoms. She has tried a warm compress and a cold compress (Advil) for the symptoms. The treatment provided no relief.   HPI Comments: Phyllis Moore is a 54 y.o. female who presents to the Emergency Department complaining of gradually worsening, constant, moderate, right shoulder pain that began 1 month ago. She reports the pain is exacerbated with all movements. She has been taken advil and applied hot and cold compresses with no relief. She denies any injury, activity, or lifting attributable to her right shoulder pain. Additionally denies the use of steroids.    Past Medical History  Diagnosis Date  . Hypertension   . Thyroid disease   . Anxiety    Past Surgical History  Procedure Laterality Date  . Abdominal hysterectomy     History reviewed. No pertinent family history. History  Substance Use Topics  . Smoking status: Passive Smoke Exposure - Never Smoker  . Smokeless tobacco: Never Used  . Alcohol Use: No   OB History    No data available     Review of Systems  Respiratory: Negative for cough, choking, chest tightness and shortness of breath.   Cardiovascular: Negative for chest pain, palpitations and leg swelling.  Musculoskeletal:  Positive for arthralgias.  All other systems reviewed and are negative.   Allergies  Review of patient's allergies indicates no known allergies.  Home Medications   Prior to Admission medications   Medication Sig Start Date End Date Taking? Authorizing Provider  alprazolam Prudy Feeler(XANAX) 2 MG tablet Take 1 tablet (2 mg total) by mouth at bedtime as needed for sleep. 08/03/14   Rolan BuccoMelanie Belfi, MD  azithromycin (ZITHROMAX) 250 MG tablet Take 1 tablet (250 mg total) by mouth daily. Take first 2 tablets together, then 1 every day until finished. 02/17/13   Elson AreasLeslie K Sofia, PA-C  benzonatate (TESSALON) 100 MG capsule Take 1 capsule (100 mg total) by mouth every 8 (eight) hours. 07/19/14   Rolland PorterMark James, MD  HYDROcodone-homatropine [facility](HYCODAN) 5-1.5 MG/5ML syrup Take 5 mLs by mouth every 6 (six) hours as needed for cough. 02/17/13   Elson AreasLeslie K Sofia, PA-C  levothyroxine (SYNTHROID, LEVOTHROID) 75 MCG tablet Take 75 mcg by mouth daily before breakfast.    Historical Provider, MD  lisinopril (PRINIVIL,ZESTRIL) 10 MG tablet Take 10 mg by mouth daily.    Historical Provider, MD  metoCLOPramide (REGLAN) 10 MG tablet Take 1 tablet (10 mg total) by mouth every 6 (six) hours as needed for nausea (nausea/headache). 10/20/13   Wayland SalinasJohn Bednar, MD  ondansetron (ZOFRAN ODT) 8 MG disintegrating tablet 8mg  ODT q4 hours prn nausea 10/20/13   Wayland SalinasJohn Bednar, MD  oxyCODONE-acetaminophen (PERCOCET) 5-325 MG per tablet Take 2 tablets by mouth every 4 (four) hours as needed. 10/20/13   Jonny RuizJohn  Bednar, MD  sertraline (ZOLOFT) 100 MG tablet Take 100 mg by mouth daily.    Historical Provider, MD   BP 108/74 mmHg  Pulse 87  Temp(Src) 98.2 F (36.8 C) (Oral)  Resp 18  Ht  (1.626 m)  Wt 223 lb 4 oz (101.266 kg)  BMI 38.30 kg/m2  SpO2 98% Physical Exam  Constitutional: She appears well-developed and well-nourished. No distress.  HENT:  Head: Normocephalic and atraumatic.  Mouth/Throat: Oropharynx is clear and moist. No oropharyngeal exudate.   Eyes: Conjunctivae and EOM are normal. Pupils are equal, round, and reactive to light. Right eye exhibits no discharge. Left eye exhibits no discharge. No scleral icterus.  Neck: Neck supple. No JVD present.  Cardiovascular: Normal rate, regular rhythm and normal heart sounds.   Cap refill less than 2 seconds; 2 + radial pulse  Pulmonary/Chest: Effort normal and breath sounds normal. No respiratory distress.  Abdominal: Soft. Bowel sounds are normal. There is no tenderness. There is no rebound and no guarding.  Musculoskeletal: Normal range of motion. She exhibits no edema.       Right shoulder: She exhibits normal range of motion, no bony tenderness, no swelling, no effusion, no crepitus, no deformity, no laceration, no spasm, normal pulse and normal strength.       Right elbow: She exhibits normal range of motion, no swelling, no effusion, no deformity and no laceration. No tenderness found. No radial head, no medial epicondyle, no lateral epicondyle and no olecranon process tenderness noted.       Right wrist: Normal.       Right hand: Normal. Normal sensation noted. Normal strength noted.  Intact bicep tendon; intact triceps; no step offs or crepitus of the C-spine; negative neer's test;  Neurological: She is alert. She has normal reflexes. Coordination normal.  Skin: Skin is warm. No rash noted. No erythema. No pallor.  Psychiatric: She has a normal mood and affect. Her behavior is normal.  Nursing note and vitals reviewed.   ED Course  Procedures  DIAGNOSTIC STUDIES: Oxygen Saturation is 98% on RA, normal by my interpretation.    COORDINATION OF CARE: 11:30 PM Discussed treatment plan which includes to order injection of toradol, Robaxin and prescribe a muscle relaxant  with pt. Xray or right shoulder ordered. Pt acknowledges and agrees to plan.   Labs Review Labs Reviewed - No data to display  Imaging Review No results found.   EKG Interpretation None      MDM   Final  diagnoses:  Pain    Heat, ROM and NSAIDs and follow up with sports medicine.    I personally performed the services described in this documentation, which was scribed in my presence. The recorded information has been reviewed and is accurate.    Cy Blamer, MD 12/25/14 [phone]

## 2014-12-24 NOTE — ED Notes (Addendum)
Pt c/o right shoulder pain x 1 month  Denies injury, increased pain with movt

## 2014-12-25 ENCOUNTER — Encounter (HOSPITAL_BASED_OUTPATIENT_CLINIC_OR_DEPARTMENT_OTHER): Payer: Self-pay | Admitting: Emergency Medicine

## 2014-12-25 MED ORDER — MELOXICAM 15 MG PO TABS: 15.0000 mg | ORAL_TABLET | Freq: Every day | ORAL | Status: DC

## 2014-12-25 MED ORDER — METHOCARBAMOL 500 MG PO TABS: 500.0000 mg | ORAL_TABLET | Freq: Two times a day (BID) | ORAL | Status: DC

## 2015-01-21 ENCOUNTER — Encounter (HOSPITAL_BASED_OUTPATIENT_CLINIC_OR_DEPARTMENT_OTHER): Payer: Self-pay | Admitting: Emergency Medicine

## 2015-01-21 ENCOUNTER — Emergency Department (HOSPITAL_BASED_OUTPATIENT_CLINIC_OR_DEPARTMENT_OTHER)
Admission: EM | Admit: 2015-01-21 | Discharge: 2015-01-22 | Disposition: A | Discharge status: Home / Self Care | Payer: Self-pay | Attending: Emergency Medicine | Admitting: Emergency Medicine

## 2015-01-21 DIAGNOSIS — F419 Anxiety disorder, unspecified: Secondary | ICD-10-CM | POA: Insufficient documentation

## 2015-01-21 DIAGNOSIS — E079 Disorder of thyroid, unspecified: Secondary | ICD-10-CM | POA: Insufficient documentation

## 2015-01-21 DIAGNOSIS — Z79899 Other long term (current) drug therapy: Secondary | ICD-10-CM | POA: Insufficient documentation

## 2015-01-21 DIAGNOSIS — M25511 Pain in right shoulder: Secondary | ICD-10-CM | POA: Insufficient documentation

## 2015-01-21 DIAGNOSIS — I10 Essential (primary) hypertension: Secondary | ICD-10-CM | POA: Insufficient documentation

## 2015-01-21 DIAGNOSIS — E669 Obesity, unspecified: Secondary | ICD-10-CM | POA: Insufficient documentation

## 2015-01-21 MED ORDER — IBUPROFEN 600 MG PO TABS: 600.0000 mg | ORAL_TABLET | Freq: Four times a day (QID) | ORAL | Status: DC | PRN

## 2015-01-21 MED ORDER — CYCLOBENZAPRINE HCL 10 MG PO TABS
5.0000 mg | ORAL_TABLET | Freq: Once | ORAL | Status: AC
  Administered 2015-01-21: 5 mg via ORAL
  Filled 2015-01-21: qty 1

## 2015-01-21 MED ORDER — CYCLOBENZAPRINE HCL 10 MG PO TABS: 10.0000 mg | ORAL_TABLET | Freq: Two times a day (BID) | ORAL | Status: DC | PRN

## 2015-01-21 MED ORDER — KETOROLAC TROMETHAMINE 60 MG/2ML IM SOLN
60.0000 mg | Freq: Once | INTRAMUSCULAR | Status: AC
  Administered 2015-01-21: 60 mg via INTRAMUSCULAR
  Filled 2015-01-21: qty 2

## 2015-01-21 NOTE — Discharge Instructions (Signed)
You were seen today for shoulder pain. You may have some element of tendinitis.  Previously x-rays have been reassuring. You need to make sure that you are moving your shoulder to keep her shoulder from freezing up. Below are some exercises. You should follow-up with sports medicine.  Shoulder Exercises EXERCISES  RANGE OF MOTION (ROM) AND STRETCHING EXERCISES These exercises may help you when beginning to rehabilitate your injury. Your symptoms may resolve with or without further involvement from your physician, physical therapist or athletic trainer. While completing these exercises, remember:   Restoring tissue flexibility helps normal motion to return to the joints. This allows healthier, less painful movement and activity.  An effective stretch should be held for at least 30 seconds.  A stretch should never be painful. You should only feel a gentle lengthening or release in the stretched tissue. ROM - Pendulum  Bend at the waist so that your right / left arm falls away from your body. Support yourself with your opposite hand on a solid surface, such as a table or a countertop.  Your right / left arm should be perpendicular to the ground. If it is not perpendicular, you need to lean over farther. Relax the muscles in your right / left arm and shoulder as much as possible.  Gently sway your hips and trunk so they move your right / left arm without any use of your right / left shoulder muscles.  Progress your movements so that your right / left arm moves side to side, then forward and backward, and finally, both clockwise and counterclockwise.  Complete __________ repetitions in each direction. Many people use this exercise to relieve discomfort in their shoulder as well as to gain range of motion. Repeat __________ times. Complete this exercise __________ times per day. STRETCH - Flexion, Standing  Stand with good posture. With an underhand grip on your right / left hand and an overhand  grip on the opposite hand, grasp a broomstick or cane so that your hands are a little more than shoulder-width apart.  Keeping your right / left elbow straight and shoulder muscles relaxed, push the stick with your opposite hand to raise your right / left arm in front of your body and then overhead. Raise your arm until you feel a stretch in your right / left shoulder, but before you have increased shoulder pain.  Try to avoid shrugging your right / left shoulder as your arm rises by keeping your shoulder blade tucked down and toward your mid-back spine. Hold __________ seconds.  Slowly return to the starting position. Repeat __________ times. Complete this exercise __________ times per day. STRETCH - Internal Rotation  Place your right / left hand behind your back, palm-up.  Throw a towel or belt over your opposite shoulder. Grasp the towel/belt with your right / left hand.  While keeping an upright posture, gently pull up on the towel/belt until you feel a stretch in the front of your right / left shoulder.  Avoid shrugging your right / left shoulder as your arm rises by keeping your shoulder blade tucked down and toward your mid-back spine.  Hold __________. Release the stretch by lowering your opposite hand. Repeat __________ times. Complete this exercise __________ times per day. STRETCH - External Rotation and Abduction  Stagger your stance through a doorframe. It does not matter which foot is forward.  As instructed by your physician, physical therapist or athletic trainer, place your hands:  And forearms above your head and on  the door frame.  And forearms at head-height and on the door frame.  At elbow-height and on the door frame.  Keeping your head and chest upright and your stomach muscles tight to prevent over-extending your low-back, slowly shift your weight onto your front foot until you feel a stretch across your chest and/or in the front of your shoulders.  Hold  __________ seconds. Shift your weight to your back foot to release the stretch. Repeat __________ times. Complete this stretch __________ times per day.  STRENGTHENING EXERCISES  These exercises may help you when beginning to rehabilitate your injury. They may resolve your symptoms with or without further involvement from your physician, physical therapist or athletic trainer. While completing these exercises, remember:   Muscles can gain both the endurance and the strength needed for everyday activities through controlled exercises.  Complete these exercises as instructed by your physician, physical therapist or athletic trainer. Progress the resistance and repetitions only as guided.  You may experience muscle soreness or fatigue, but the pain or discomfort you are trying to eliminate should never worsen during these exercises. If this pain does worsen, stop and make certain you are following the directions exactly. If the pain is still present after adjustments, discontinue the exercise until you can discuss the trouble with your clinician.  If advised by your physician, during your recovery, avoid activity or exercises which involve actions that place your right / left hand or elbow above your head or behind your back or head. These positions stress the tissues which are trying to heal. STRENGTH - Scapular Depression and Adduction  With good posture, sit on a firm chair. Supported your arms in front of you with pillows, arm rests or a table top. Have your elbows in line with the sides of your body.  Gently draw your shoulder blades down and toward your mid-back spine. Gradually increase the tension without tensing the muscles along the top of your shoulders and the back of your neck.  Hold for __________ seconds. Slowly release the tension and relax your muscles completely before completing the next repetition.  After you have practiced this exercise, remove the arm support and complete it in  standing as well as sitting. Repeat __________ times. Complete this exercise __________ times per day.  STRENGTH - External Rotators  Secure a rubber exercise band/tubing to a fixed object so that it is at the same height as your right / left elbow when you are standing or sitting on a firm surface.  Stand or sit so that the secured exercise band/tubing is at your side that is not injured.  Bend your elbow 90 degrees. Place a folded towel or small pillow under your right / left arm so that your elbow is a few inches away from your side.  Keeping the tension on the exercise band/tubing, pull it away from your body, as if pivoting on your elbow. Be sure to keep your body steady so that the movement is only coming from your shoulder rotating.  Hold __________ seconds. Release the tension in a controlled manner as you return to the starting position. Repeat __________ times. Complete this exercise __________ times per day.  STRENGTH - Supraspinatus  Stand or sit with good posture. Grasp a __________ weight or an exercise band/tubing so that your hand is "thumbs-up," like when you shake hands.  Slowly lift your right / left hand from your thigh into the air, traveling about 30 degrees from straight out at your side. Lift  your hand to shoulder height or as far as you can without increasing any shoulder pain. Initially, many people do not lift their hands above shoulder height.  Avoid shrugging your right / left shoulder as your arm rises by keeping your shoulder blade tucked down and toward your mid-back spine.  Hold for __________ seconds. Control the descent of your hand as you slowly return to your starting position. Repeat __________ times. Complete this exercise __________ times per day.  STRENGTH - Shoulder Extensors  Secure a rubber exercise band/tubing so that it is at the height of your shoulders when you are either standing or sitting on a firm arm-less chair.  With a thumbs-up grip,  grasp an end of the band/tubing in each hand. Straighten your elbows and lift your hands straight in front of you at shoulder height. Step back away from the secured end of band/tubing until it becomes tense.  Squeezing your shoulder blades together, pull your hands down to the sides of your thighs. Do not allow your hands to go behind you.  Hold for __________ seconds. Slowly ease the tension on the band/tubing as you reverse the directions and return to the starting position. Repeat __________ times. Complete this exercise __________ times per day.  STRENGTH - Scapular Retractors  Secure a rubber exercise band/tubing so that it is at the height of your shoulders when you are either standing or sitting on a firm arm-less chair.  With a palm-down grip, grasp an end of the band/tubing in each hand. Straighten your elbows and lift your hands straight in front of you at shoulder height. Step back away from the secured end of band/tubing until it becomes tense.  Squeezing your shoulder blades together, draw your elbows back as you bend them. Keep your upper arm lifted away from your body throughout the exercise.  Hold __________ seconds. Slowly ease the tension on the band/tubing as you reverse the directions and return to the starting position. Repeat __________ times. Complete this exercise __________ times per day. STRENGTH - Scapular Depressors  Find a sturdy chair without wheels, such as a from a dining room table.  Keeping your feet on the floor, lift your bottom from the seat and lock your elbows.  Keeping your elbows straight, allow gravity to pull your body weight down. Your shoulders will rise toward your ears.  Raise your body against gravity by drawing your shoulder blades down your back, shortening the distance between your shoulders and ears. Although your feet should always maintain contact with the floor, your feet should progressively support less body weight as you get  stronger.  Hold __________ seconds. In a controlled and slow manner, lower your body weight to begin the next repetition. Repeat __________ times. Complete this exercise __________ times per day.  Document Released: 04/06/2005 Document Revised: 08/15/2011 Document Reviewed: 09/04/2008 [facility] Patient Information 2015 [place], [place]. This information is not intended to replace advice given to you by your health care provider. Make sure you discuss any questions you have with your health care provider.

## 2015-01-21 NOTE — ED Notes (Signed)
Right arm pain x2 days.  Sts hx of tendonitis and has reoccurred 2 days ago.

## 2015-01-21 NOTE — ED Provider Notes (Signed)
CSN: [id number]     Arrival date & time 01/21/15  2209 History   This chart was scribed for Phyllis Baton, MD by Arlan Organ, ED Scribe. This patient was seen in room MH10/MH10 and the patient's care was started 11:04 PM.   Chief Complaint  Patient presents with  . Arm Pain   The history is provided by the patient. No language interpreter was used.    HPI Comments: Phyllis Moore is a 54 y.o. female with a PMHx of HTN and thyroid disease who presents to the Emergency Department complaining of intermittent, ongoing R arm pain x 3 months; worsened and constant in last 2 days. No recent injury or trauma. Pain is currently rated 9/10 and described as throbbing. Discomfort is exacerbated with movement. No alleviating factors at this time. Prescribed Malaxacam and OTC Aleve attempted at home with no relief. No recent fever, chills, nausea, or vomiting. Now weakness, numbness, or loss of sensation. 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 No known allergies to medication.  Past Medical History  Diagnosis Date  . Hypertension   . Thyroid disease   . Anxiety    Past Surgical History  Procedure Laterality Date  . Abdominal hysterectomy     No family history on file. Social History  Substance Use Topics  . Smoking status: Never Smoker   . Smokeless tobacco: Never Used  . Alcohol Use: No   OB History    No data available     Review of Systems  Constitutional: Negative for fever and chills.  Respiratory: Negative for cough and shortness of breath.   Cardiovascular: Negative for chest pain.  Gastrointestinal: Negative for nausea, vomiting and abdominal pain.  Musculoskeletal: Positive for arthralgias.  Skin: Negative for rash.  Neurological: Negative for headaches.  Psychiatric/Behavioral: Negative for confusion.  All other systems reviewed and are negative.     Allergies  Review of patient's allergies indicates no known allergies.  Home  Medications   Prior to Admission medications   Medication Sig Start Date End Date Taking? Authorizing Provider  furosemide (LASIX) 20 MG tablet Take 10 mg by mouth daily.   Yes Historical Provider, MD  alprazolam Prudy Feeler) 2 MG tablet Take 1 tablet (2 mg total) by mouth at bedtime as needed for sleep. 08/03/14   Rolan Bucco, MD  cyclobenzaprine (FLEXERIL) 10 MG tablet Take 1 tablet (10 mg total) by mouth 2 (two) times daily as needed for muscle spasms. 01/21/15   Phyllis Baton, MD  ibuprofen (ADVIL,MOTRIN) 600 MG tablet Take 1 tablet (600 mg total) by mouth every 6 (six) hours as needed. 01/21/15   Phyllis Baton, MD  levothyroxine (SYNTHROID, LEVOTHROID) 75 MCG tablet Take 75 mcg by mouth daily before breakfast.    Historical Provider, MD  lisinopril (PRINIVIL,ZESTRIL) 10 MG tablet Take 10 mg by mouth daily.    Historical Provider, MD   Triage Vitals: BP 131/88 mmHg  Pulse 94  Temp(Src) 98.2 F (36.8 C) (Oral)  Resp 20  Ht  (1.626 m)  Wt 210 lb (95.255 kg)  BMI 36.03 kg/m2  SpO2 100%   Physical Exam  Constitutional: She is oriented to person, place, and time. She appears well-developed and well-nourished.  Obese  HENT:  Head: Normocephalic and atraumatic.  Cardiovascular: Normal rate, regular rhythm and normal heart sounds.   No murmur heard. Pulmonary/Chest: Effort normal and breath sounds normal. No respiratory distress. She has no wheezes.  Musculoskeletal:  Limited range  of motion of the right shoulder secondary to pain, difficulty with abduction flexion, extension, shoulder appears approximated in the glenoid, no tenderness of the clavicle or before meals joint, 2+ radial pulses, sensation and strength intact distally  Neurological: She is alert and oriented to person, place, and time.  Skin: Skin is warm and dry.  Psychiatric: She has a normal mood and affect.  Nursing note and vitals reviewed.   ED Course  Procedures (including critical care time)  DIAGNOSTIC  STUDIES: Oxygen Saturation is 100% on RA, Normal by my interpretation.    COORDINATION OF CARE: 11:10 PM-Discussed treatment plan with pt at bedside and pt agreed to plan.     Labs Review Labs Reviewed - No data to display  Imaging Review No results found. I have personally reviewed and evaluated these images and lab results as part of my medical decision-making.   EKG Interpretation None      MDM   Final diagnoses:  Shoulder pain, acute, right    Patient presents with acute on chronic right shoulder pain. Has been seen here previously and diagnosed with tendinitis. X-rays at that time were reassuring. No new injury. Patient reports pain has worsened again. She has limited range of motion but this appears secondary to pain. She may be developing an element of frozen shoulder. Neurovascularly intact. She did not follow-up with sports medicine as previously directed. Patient given Toradol and Flexeril. Will discharge with ibuprofen and Flexeril. Patient follow-up with Dr. Pearletha Forge. Patient was given shoulder exercises.  After history, exam, and medical workup I feel the patient has been appropriately medically screened and is safe for discharge home. Pertinent diagnoses were discussed with the patient. Patient was given return precautions.  I personally performed the services described in this documentation, which was scribed in my presence. The recorded information has been reviewed and is accurate.   Phyllis Baton, MD 01/21/15 [phone]

## 2015-01-23 ENCOUNTER — Ambulatory Visit (INDEPENDENT_AMBULATORY_CARE_PROVIDER_SITE_OTHER): Payer: Self-pay | Admitting: Family Medicine

## 2015-01-23 ENCOUNTER — Encounter: Payer: Self-pay | Admitting: Family Medicine

## 2015-01-23 VITALS — BP 122/73 | Ht 64.0 in | Wt 210.0 lb

## 2015-01-23 DIAGNOSIS — M25511 Pain in right shoulder: Secondary | ICD-10-CM

## 2015-01-23 MED ORDER — METHYLPREDNISOLONE ACETATE 40 MG/ML IJ SUSP
40.0000 mg | Freq: Once | INTRAMUSCULAR | Status: AC
  Administered 2015-01-23: 40 mg via INTRA_ARTICULAR

## 2015-01-23 NOTE — Patient Instructions (Signed)
You have a frozen shoulder (adhesive capsulitis), a buildup of scar tissue that limits motion of the shoulder joint. Limit lifting and overhead activities as much as possible. Heat 15 minutes at a time 3-4 times a day may help with movement and stiffness. Tylenol and/or aleve as needed for pain and inflammation. Steroid injections in a series have been shown to help with pain and motion - you were given one of these today. Codman exercises (pendulum, wall walking or table slides, arm circles) - do 3 sets of 10 once or twice a day. Physical therapy for rotator cuff strengthening is a consideration once you are out of the painful phase Follow up in 1 month to 6 weeks.

## 2015-01-27 DIAGNOSIS — F32A Depression, unspecified: Secondary | ICD-10-CM | POA: Insufficient documentation

## 2015-01-27 DIAGNOSIS — E079 Disorder of thyroid, unspecified: Secondary | ICD-10-CM | POA: Insufficient documentation

## 2015-01-27 DIAGNOSIS — F419 Anxiety disorder, unspecified: Secondary | ICD-10-CM | POA: Insufficient documentation

## 2015-01-27 DIAGNOSIS — F329 Major depressive disorder, single episode, unspecified: Secondary | ICD-10-CM | POA: Insufficient documentation

## 2015-01-27 DIAGNOSIS — I10 Essential (primary) hypertension: Secondary | ICD-10-CM | POA: Insufficient documentation

## 2015-01-27 NOTE — Progress Notes (Signed)
PCP: Abelina Bachelor, MD  Subjective:   HPI: Patient is a 54 y.o. female here for right shoulder pain.  Patient denies known injury. She states over past 4 months she has had worsening right shoulder pain and motion. Tried topical medicines like biofreeze, aleve, ibuprofen. No prior issues with this shoulder. No history of diabetes. Given toradol and flexeril in ED. Right handed. + night pain.  Past Medical History  Diagnosis Date  . Hypertension   . Thyroid disease   . Anxiety     Current Outpatient Prescriptions on File Prior to Visit  Medication Sig Dispense Refill  . alprazolam (XANAX) 2 MG tablet Take 1 tablet (2 mg total) by mouth at bedtime as needed for sleep. 10 tablet 0  . cyclobenzaprine (FLEXERIL) 10 MG tablet Take 1 tablet (10 mg total) by mouth 2 (two) times daily as needed for muscle spasms. 20 tablet 0  . furosemide (LASIX) 20 MG tablet Take 10 mg by mouth daily.    Marland Kitchen ibuprofen (ADVIL,MOTRIN) 600 MG tablet Take 1 tablet (600 mg total) by mouth every 6 (six) hours as needed. 21 tablet 0  . levothyroxine (SYNTHROID, LEVOTHROID) 75 MCG tablet Take 75 mcg by mouth daily before breakfast.    . lisinopril (PRINIVIL,ZESTRIL) 10 MG tablet Take 10 mg by mouth daily.    . [DISCONTINUED] metoCLOPramide (REGLAN) 10 MG tablet Take 1 tablet (10 mg total) by mouth every 6 (six) hours as needed for nausea (nausea/headache). 6 tablet 0  . [DISCONTINUED] sertraline (ZOLOFT) 100 MG tablet Take 100 mg by mouth daily.     No current facility-administered medications on file prior to visit.    Past Surgical History  Procedure Laterality Date  . Abdominal hysterectomy      No Known Allergies  Social History   Social History  . Marital Status: Single    Spouse Name: N/A  . Number of Children: N/A  . Years of Education: N/A   Occupational History  . Not on file.   Social History Main Topics  . Smoking status: Never Smoker   . Smokeless tobacco: Never Used  . Alcohol  Use: No  . Drug Use: No  . Sexual Activity: No   Other Topics Concern  . Not on file   Social History Narrative    No family history on file.  BP 122/73 mmHg  Ht  (1.626 m)  Wt 210 lb (95.255 kg)  BMI 36.03 kg/m2  Review of Systems: See HPI above.    Objective:  Physical Exam:  Gen: NAD  Right shoulder: No swelling, ecchymoses.  No gross deformity. Diffuse TTP. ROM 10 degrees ER, 90 flexion, 80 abduction - unable to passively move beyond these. Positive Hawkins, Neers. Negative Yergasons. Strength 5/5 with empty can and resisted internal/external rotation. NV intact distally.    Assessment & Plan:  1. Right shoulder pain - 2/2 adhesive capsulitis.  Discussed options - went ahead with combination intraarticular/subacromial injection.  Shown codman exercises to do daily.  Heat regularly.  F/u in 1 month to 6 weeks.  After informed written consent, patient was seated on exam table. Right shoulder was prepped with alcohol swab and utilizing posterior approach, patient's right shoulder was injected with 6:2 marcaine:depomedrol with half in the subacromial space and half in glenohumeral space.  Patient tolerated the procedure well without immediate complications.

## 2015-01-27 NOTE — Assessment & Plan Note (Signed)
2/2 adhesive capsulitis.  Discussed options - went ahead with combination intraarticular/subacromial injection.  Shown codman exercises to do daily.  Heat regularly.  F/u in 1 month to 6 weeks.  After informed written consent, patient was seated on exam table. Right shoulder was prepped with alcohol swab and utilizing posterior approach, patient's right shoulder was injected with 6:2 marcaine:depomedrol with half in the subacromial space and half in glenohumeral space.  Patient tolerated the procedure well without immediate complications.

## 2015-01-28 ENCOUNTER — Telehealth: Payer: Self-pay | Admitting: Family Medicine

## 2015-01-29 NOTE — Telephone Encounter (Signed)
May still take a little time for the shot to kick in and it's not unusual to need to do 2-3 shots spaced a month apart for this condition.  While an MRI, physical therapy are considerations they are usually not helpful at this stage of a frozen shoulder.  Continue with home exercises.

## 2015-02-02 ENCOUNTER — Encounter (INDEPENDENT_AMBULATORY_CARE_PROVIDER_SITE_OTHER): Payer: Self-pay

## 2015-02-02 ENCOUNTER — Ambulatory Visit (INDEPENDENT_AMBULATORY_CARE_PROVIDER_SITE_OTHER): Payer: Self-pay | Admitting: Family Medicine

## 2015-02-02 ENCOUNTER — Encounter: Payer: Self-pay | Admitting: Family Medicine

## 2015-02-02 VITALS — BP 125/78 | HR 89 | Ht 64.0 in | Wt 205.0 lb

## 2015-02-02 DIAGNOSIS — M25511 Pain in right shoulder: Secondary | ICD-10-CM

## 2015-02-02 MED ORDER — OXYCODONE-ACETAMINOPHEN 5-325 MG PO TABS: 1.0000 | ORAL_TABLET | Freq: Four times a day (QID) | ORAL | Status: DC | PRN

## 2015-02-02 MED ORDER — DICLOFENAC SODIUM 75 MG PO TBEC: 75.0000 mg | DELAYED_RELEASE_TABLET | Freq: Two times a day (BID) | ORAL | Status: DC

## 2015-02-02 NOTE — Patient Instructions (Signed)
Call me when the cone coverage goes through to go ahead with an MRI. Take diclofenac twice a day with food for pain and inflammation every day. Percocet as needed for severe pain (no driving on this). We could repeat the injection on the 16th if necessary.

## 2015-02-04 NOTE — Progress Notes (Signed)
PCP: Abelina Bachelor, MD  Subjective:   HPI: Patient is a 54 y.o. female here for right shoulder pain.  8/19: Patient denies known injury. She states over past 4 months she has had worsening right shoulder pain and motion. Tried topical medicines like biofreeze, aleve, ibuprofen. No prior issues with this shoulder. No history of diabetes. Given toradol and flexeril in ED. Right handed. + night pain.  8/29: Patient reports pain still severe - at 9/10 level. Worse with all movements. No change with injection.  Past Medical History  Diagnosis Date  . Hypertension   . Thyroid disease   . Anxiety     Current Outpatient Prescriptions on File Prior to Visit  Medication Sig Dispense Refill  . ALPRAZolam (XANAX) 1 MG tablet TAKE one tablet BY MOUTH TWO TIMES DAILY AS NEEDED for sleep    . alprazolam (XANAX) 2 MG tablet Take 1 tablet (2 mg total) by mouth at bedtime as needed for sleep. 10 tablet 0  . Beclomethasone Dipropionate (QNASL) 80 MCG/ACT AERS Place 4 sprays into the nose.    . cyclobenzaprine (FLEXERIL) 10 MG tablet Take 1 tablet (10 mg total) by mouth 2 (two) times daily as needed for muscle spasms. 20 tablet 0  . FLUoxetine (PROZAC) 20 MG capsule Take 20 mg by mouth.    . furosemide (LASIX) 20 MG tablet Take 10 mg by mouth daily.    . furosemide (LASIX) 40 MG tablet Take 40 mg by mouth.    . levothyroxine (SYNTHROID) 50 MCG tablet Take 50 mcg by mouth.    . levothyroxine (SYNTHROID, LEVOTHROID) 75 MCG tablet Take 75 mcg by mouth daily before breakfast.    . lisinopril (PRINIVIL,ZESTRIL) 10 MG tablet Take 10 mg by mouth daily.    Marland Kitchen lisinopril (PRINIVIL,ZESTRIL) 10 MG tablet Take 1 tablet (10 mg total) by mouth daily.    . [DISCONTINUED] metoCLOPramide (REGLAN) 10 MG tablet Take 1 tablet (10 mg total) by mouth every 6 (six) hours as needed for nausea (nausea/headache). 6 tablet 0  . [DISCONTINUED] sertraline (ZOLOFT) 100 MG tablet Take 100 mg by mouth daily.     No  current facility-administered medications on file prior to visit.    Past Surgical History  Procedure Laterality Date  . Abdominal hysterectomy      No Known Allergies  Social History   Social History  . Marital Status: Single    Spouse Name: N/A  . Number of Children: N/A  . Years of Education: N/A   Occupational History  . Not on file.   Social History Main Topics  . Smoking status: Never Smoker   . Smokeless tobacco: Never Used  . Alcohol Use: No  . Drug Use: No  . Sexual Activity: No   Other Topics Concern  . Not on file   Social History Narrative    No family history on file.  BP 125/78 mmHg  Pulse 89  Ht  (1.626 m)  Wt 205 lb (92.987 kg)  BMI 35.17 kg/m2  Review of Systems: See HPI above.    Objective:  Physical Exam:  Gen: NAD  Right shoulder: No swelling, ecchymoses.  No gross deformity. Diffuse TTP. ROM 10 degrees ER, 90 flexion, 80 abduction - unable to passively move beyond these. Positive Hawkins, Neers. Negative Yergasons. Strength 5/5 with empty can and resisted internal/external rotation. NV intact distally.    Assessment & Plan:  1. Right shoulder pain - 2/2 adhesive capsulitis.  Unfortunately no changes with injection.  Will consider MRI when cone coverage goes through to assess for concurrent rotator cuff tear.  Diclofenac with percocet as needed.  Continue codman exercises.

## 2015-02-04 NOTE — Assessment & Plan Note (Signed)
2/2 adhesive capsulitis.  Unfortunately no changes with injection.  Will consider MRI when cone coverage goes through to assess for concurrent rotator cuff tear.  Diclofenac with percocet as needed.  Continue codman exercises.

## 2015-02-05 ENCOUNTER — Ambulatory Visit: Payer: Self-pay

## 2015-02-16 ENCOUNTER — Ambulatory Visit (INDEPENDENT_AMBULATORY_CARE_PROVIDER_SITE_OTHER): Payer: Self-pay | Admitting: Family Medicine

## 2015-02-16 ENCOUNTER — Encounter: Payer: Self-pay | Admitting: Family Medicine

## 2015-02-16 VITALS — BP 154/103 | HR 87 | Ht 64.0 in | Wt 205.0 lb

## 2015-02-16 DIAGNOSIS — M25511 Pain in right shoulder: Secondary | ICD-10-CM

## 2015-02-16 MED ORDER — PREDNISONE 10 MG PO TABS: ORAL_TABLET | ORAL | Status: DC

## 2015-02-16 NOTE — Patient Instructions (Signed)
Take the prednisone as directed for 6 days. Do NOT take voltaren until after you're finished with the prednisone. Call me asap when the cone coverage goes through. Also call if you want to try another injection into the joint.

## 2015-02-18 ENCOUNTER — Ambulatory Visit (INDEPENDENT_AMBULATORY_CARE_PROVIDER_SITE_OTHER): Payer: Self-pay | Admitting: Family Medicine

## 2015-02-18 ENCOUNTER — Ambulatory Visit (HOSPITAL_BASED_OUTPATIENT_CLINIC_OR_DEPARTMENT_OTHER)
Admission: RE | Admit: 2015-02-18 | Discharge: 2015-02-18 | Disposition: A | Discharge status: Home / Self Care | Payer: Self-pay | Source: Ambulatory Visit | Attending: Family Medicine | Admitting: Family Medicine

## 2015-02-18 ENCOUNTER — Encounter: Payer: Self-pay | Admitting: Family Medicine

## 2015-02-18 VITALS — BP 126/85 | HR 88 | Ht 64.0 in | Wt 205.0 lb

## 2015-02-18 DIAGNOSIS — M545 Low back pain, unspecified: Secondary | ICD-10-CM

## 2015-02-18 DIAGNOSIS — M25511 Pain in right shoulder: Secondary | ICD-10-CM

## 2015-02-18 MED ORDER — MELOXICAM 15 MG PO TABS: 15.0000 mg | ORAL_TABLET | Freq: Every day | ORAL | Status: DC

## 2015-02-18 MED ORDER — METHOCARBAMOL 500 MG PO TABS: 500.0000 mg | ORAL_TABLET | Freq: Four times a day (QID) | ORAL | Status: DC | PRN

## 2015-02-18 NOTE — Patient Instructions (Addendum)
Get x-rays of your back and shoulder as you leave today - we will call you with the results. Use sling only if needed for pain - this can make the motion worse in the long run. Meloxicam  daily with food for pain and inflammation. Robaxin as needed for muscle spasms. Use pain medicine as prescribed by your PCP - contact them if you need additional medicine. I would like to go ahead with an MRI of this shoulder, physical therapy of the back but we would need the cone coverage first. Call me when the coverage goes through.

## 2015-02-18 NOTE — Progress Notes (Signed)
PCP: Abelina Bachelor, MD  Subjective:   HPI: Patient is a 54 y.o. female here for right shoulder pain.  8/19: Patient denies known injury. She states over past 4 months she has had worsening right shoulder pain and motion. Tried topical medicines like biofreeze, aleve, ibuprofen. No prior issues with this shoulder. No history of diabetes. Given toradol and flexeril in ED. Right handed. + night pain.  8/29: Patient reports pain still severe - at 9/10 level. Worse with all movements. No change with injection.  9/12: Patient reports she feels maybe slightly better. Pain level 7/10 Pain medication helped. Cortisone injection did not help. Doing some home exercises.  Past Medical History  Diagnosis Date  . Hypertension   . Thyroid disease   . Anxiety     Current Outpatient Prescriptions on File Prior to Visit  Medication Sig Dispense Refill  . ALPRAZolam (XANAX) 1 MG tablet TAKE one tablet BY MOUTH TWO TIMES DAILY AS NEEDED for sleep    . alprazolam (XANAX) 2 MG tablet Take 1 tablet (2 mg total) by mouth at bedtime as needed for sleep. 10 tablet 0  . Beclomethasone Dipropionate (QNASL) 80 MCG/ACT AERS Place 4 sprays into the nose.    . diclofenac (VOLTAREN) 75 MG EC tablet Take 1 tablet (75 mg total) by mouth 2 (two) times daily. 60 tablet 1  . FLUoxetine (PROZAC) 20 MG capsule Take 20 mg by mouth.    . furosemide (LASIX) 40 MG tablet Take 40 mg by mouth.    . levothyroxine (SYNTHROID, LEVOTHROID) 75 MCG tablet Take 75 mcg by mouth daily before breakfast.    . lisinopril (PRINIVIL,ZESTRIL) 10 MG tablet Take 1 tablet (10 mg total) by mouth daily.    . [DISCONTINUED] metoCLOPramide (REGLAN) 10 MG tablet Take 1 tablet (10 mg total) by mouth every 6 (six) hours as needed for nausea (nausea/headache). 6 tablet 0  . [DISCONTINUED] sertraline (ZOLOFT) 100 MG tablet Take 100 mg by mouth daily.     No current facility-administered medications on file prior to visit.    Past  Surgical History  Procedure Laterality Date  . Abdominal hysterectomy      No Known Allergies  Social History   Social History  . Marital Status: Single    Spouse Name: N/A  . Number of Children: N/A  . Years of Education: N/A   Occupational History  . Not on file.   Social History Main Topics  . Smoking status: Never Smoker   . Smokeless tobacco: Never Used  . Alcohol Use: No  . Drug Use: No  . Sexual Activity: No   Other Topics Concern  . Not on file   Social History Narrative    No family history on file.  BP 154/103 mmHg  Pulse 87  Ht  (1.626 m)  Wt 205 lb (92.987 kg)  BMI 35.17 kg/m2  Review of Systems: See HPI above.    Objective:  Physical Exam:  Gen: NAD  Right shoulder: No swelling, ecchymoses.  No gross deformity. Diffuse TTP. ROM 30 degrees ER, 80 flexion, 70 abduction - unable to passively move beyond these. Positive Hawkins, Neers. Negative Yergasons. Strength 5/5 with empty can and resisted internal/external rotation.  Pain all motions. NV intact distally.    Assessment & Plan:  1. Right shoulder pain - 2/2 adhesive capsulitis.  As noted previously, not much change following injection and home exercises.  Has not completed paperwork to get cone coverage so we could proceed with  MRI to assess for concurrent rotator cuff tear.  Continue codman exercises.  Try prednisone dose pack.  She declined repeat intraarticular injection.  Advised we would not refill narcotic pain medication.

## 2015-02-18 NOTE — Assessment & Plan Note (Signed)
2/2 adhesive capsulitis.  As noted previously, not much change following injection and home exercises.  Has not completed paperwork to get cone coverage so we could proceed with MRI to assess for concurrent rotator cuff tear.  Continue codman exercises.  Try prednisone dose pack.  She declined repeat intraarticular injection.  Advised we would not refill narcotic pain medication.

## 2015-02-20 ENCOUNTER — Ambulatory Visit: Payer: Self-pay | Admitting: Family Medicine

## 2015-02-23 DIAGNOSIS — M545 Low back pain, unspecified: Secondary | ICD-10-CM | POA: Insufficient documentation
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
## 2015-02-24 ENCOUNTER — Ambulatory Visit: Payer: Self-pay | Admitting: Physical Therapy

## 2015-02-27 ENCOUNTER — Ambulatory Visit: Payer: Self-pay | Admitting: Family Medicine

## 2015-03-02 ENCOUNTER — Ambulatory Visit: Payer: Self-pay | Admitting: Family Medicine

## 2015-04-10 ENCOUNTER — Encounter (HOSPITAL_COMMUNITY): Payer: Self-pay | Admitting: Emergency Medicine

## 2015-04-10 ENCOUNTER — Emergency Department (HOSPITAL_COMMUNITY)
Admission: EM | Admit: 2015-04-10 | Discharge: 2015-04-10 | Disposition: A | Discharge status: Home / Self Care | Payer: Self-pay | Attending: Emergency Medicine | Admitting: Emergency Medicine

## 2015-04-10 DIAGNOSIS — I10 Essential (primary) hypertension: Secondary | ICD-10-CM | POA: Insufficient documentation

## 2015-04-10 DIAGNOSIS — F419 Anxiety disorder, unspecified: Secondary | ICD-10-CM | POA: Insufficient documentation

## 2015-04-10 DIAGNOSIS — M7501 Adhesive capsulitis of right shoulder: Secondary | ICD-10-CM | POA: Insufficient documentation

## 2015-04-10 DIAGNOSIS — Z7951 Long term (current) use of inhaled steroids: Secondary | ICD-10-CM | POA: Insufficient documentation

## 2015-04-10 DIAGNOSIS — E079 Disorder of thyroid, unspecified: Secondary | ICD-10-CM | POA: Insufficient documentation

## 2015-04-10 DIAGNOSIS — Z791 Long term (current) use of non-steroidal anti-inflammatories (NSAID): Secondary | ICD-10-CM | POA: Insufficient documentation

## 2015-04-10 MED ORDER — METHOCARBAMOL 500 MG PO TABS: 500.0000 mg | ORAL_TABLET | Freq: Four times a day (QID) | ORAL | Status: DC | PRN

## 2015-04-10 MED ORDER — KETOROLAC TROMETHAMINE 60 MG/2ML IM SOLN
60.0000 mg | Freq: Once | INTRAMUSCULAR | Status: AC
  Administered 2015-04-10: 60 mg via INTRAMUSCULAR
  Filled 2015-04-10: qty 2

## 2015-04-10 MED ORDER — NAPROXEN 500 MG PO TABS: 500.0000 mg | ORAL_TABLET | Freq: Two times a day (BID) | ORAL | Status: DC

## 2015-04-10 NOTE — ED Provider Notes (Signed)
CSN: [id number]     Arrival date & time 04/10/15  2014 History  By signing my name below, I, Elon Spanner, attest that this documentation has been prepared under the direction and in the presence of Fayrene Helper, PA-C. Electronically Signed: Elon Spanner ED Scribe. 04/10/2015. 8:46 PM.    Chief Complaint  Patient presents with  . Arm Pain   The history is provided by the patient. No language interpreter was used.   HPI Comments: Phyllis Moore is a 54 y.o. female who presents to the Emergency Department complaining of throbbing persistent right arm pain originating in the shoulder and radiating down the arm onset 5 months ago without injury.  She has used Biofreeze and ibuprofen without relief.  She reports decreased ROM and worse pain with attempted ROM.  The patient reports she has been seen by [facility] for this injury and was told there was a rotator cuff injury.  She has also had normal imaging of the shoulders and prior therapies.  She has f/u scheduled for 11/9.  She denies hx of DM.  She denies CP, fever, chills, rash, neck pain.  Past Medical History  Diagnosis Date  . Hypertension   . Thyroid disease   . Anxiety    Past Surgical History  Procedure Laterality Date  . Abdominal hysterectomy     No family history on file. Social History  Substance Use Topics  . Smoking status: Never Smoker   . Smokeless tobacco: Never Used  . Alcohol Use: No   OB History    No data available     Review of Systems  Constitutional: Negative for fever and chills.  Cardiovascular: Negative for chest pain.  Musculoskeletal: Positive for arthralgias. Negative for neck pain.  Skin: Negative for rash.      Allergies  Review of patient's allergies indicates no known allergies.  Home Medications   Prior to Admission medications   Medication Sig Start Date End Date Taking? Authorizing Provider  ALPRAZolam Prudy Feeler) 1 MG tablet TAKE one tablet BY MOUTH TWO TIMES DAILY AS NEEDED for  sleep 01/08/15   Historical Provider, MD  alprazolam Prudy Feeler) 2 MG tablet Take 1 tablet (2 mg total) by mouth at bedtime as needed for sleep. 08/03/14   Rolan Bucco, MD  Beclomethasone Dipropionate (QNASL) 80 MCG/ACT AERS Place 4 sprays into the nose. 10/01/12   Historical Provider, MD  FLUoxetine (PROZAC) 20 MG capsule Take 20 mg by mouth. 04/16/14 04/16/15  Historical Provider, MD  furosemide (LASIX) 40 MG tablet Take 40 mg by mouth. 11/11/14 11/11/15  Historical Provider, MD  levothyroxine (SYNTHROID, LEVOTHROID) 75 MCG tablet Take 75 mcg by mouth daily before breakfast.    Historical Provider, MD  lisinopril (PRINIVIL,ZESTRIL) 10 MG tablet Take 1 tablet (10 mg total) by mouth daily. 04/16/14   Historical Provider, MD  meloxicam (MOBIC) 15 MG tablet Take 1 tablet (15 mg total) by mouth daily. 02/18/15   Lenda Kelp, MD  methocarbamol (ROBAXIN) 500 MG tablet Take 1 tablet (500 mg total) by mouth every 6 (six) hours as needed for muscle spasms. 02/18/15   Lenda Kelp, MD   BP 128/93 mmHg  Pulse 71  Temp(Src) 98.3 F (36.8 C) (Oral)  Resp 16  SpO2 97% Physical Exam  Constitutional: She is oriented to person, place, and time. She appears well-developed and well-nourished. No distress.  HENT:  Head: Normocephalic and atraumatic.  Eyes: Conjunctivae and EOM are normal.  Neck: Neck supple. No tracheal deviation present.  Cardiovascular: Normal rate.   Pulmonary/Chest: Effort normal. No respiratory distress.  Musculoskeletal: Normal range of motion.  Tenderness along the right trapezius along lateral deltoid of right shoulder.  Decreased shoulder flexion due to pain with both passive and active movement.    Neurological: She is alert and oriented to person, place, and time.  Skin: Skin is warm and dry.  Psychiatric: She has a normal mood and affect. Her behavior is normal.  Nursing note and vitals reviewed.   ED Course  Procedures (including critical care time)  DIAGNOSTIC  STUDIES: Oxygen Saturation is 97% on RA, normal by my interpretation.    COORDINATION OF CARE:  8:42 PM suspect rotator cuff pain causing adhesive capsulitis of R shoulder.  Doubt DVT or infectious etiology.  Doubt cervical radiculopathy.  Will order Toradol and prescribe Naproxen and muscle relaxant.  Patient acknowledges and agrees with plan.      MDM   Final diagnoses:  Adhesive capsulitis, right    BP 128/93 mmHg  Pulse 71  Temp(Src) 98.3 F (36.8 C) (Oral)  Resp 16  SpO2 97%   I personally performed the services described in this documentation, which was scribed in my presence. The recorded information has been reviewed and is accurate.      Fayrene HelperBowie Windell Musson, PA-C 04/10/15 [id number]  Pricilla LovelessScott Goldston, MD 04/14/15 [phone]

## 2015-04-10 NOTE — ED Notes (Signed)
Pt. reports chronic right arm pain for several months , denies injury , pain increases with movement .

## 2015-04-10 NOTE — Discharge Instructions (Signed)
Adhesive Capsulitis Adhesive capsulitis is inflammation of the tendons and ligaments that surround the shoulder joint (shoulder capsule). This condition causes the shoulder to become stiff and painful to move. Adhesive capsulitis is also called frozen shoulder. CAUSES This condition may be caused by: 1. An injury to the shoulder joint. 2. Straining the shoulder. 3. Not moving the shoulder for a period of time. This can happen if your arm was injured or in a sling. 4. Long-standing health problems, such as: 1. Diabetes. 2. Thyroid problems. 3. Heart disease. 4. Stroke. 5. Rheumatoid arthritis. 6. Lung disease. In some cases, the cause may not be known. RISK FACTORS This condition is more likely to develop in: 1. Women. 2. People who are older than 52 years of age. SYMPTOMS Symptoms of this condition include: 1. Pain in the shoulder when moving the arm. There may also be pain when parts of the shoulder are touched. The pain is worse at night or when at rest. 2. Soreness or aching in the shoulder. 3. Inability to move the shoulder normally. 4. Muscle spasms. DIAGNOSIS This condition is diagnosed with a physical exam and imaging tests, such as an X-ray or MRI. TREATMENT This condition may be treated with: 1. Treatment of the underlying cause or condition. 2. Physical therapy. This involves performing exercises to get the shoulder moving again. 3. Medicine. Medicine may be given to relieve pain, inflammation, or muscle spasms. 4. Steroid injections into the shoulder joint. 5. Shoulder manipulation. This is a procedure to move the shoulder into another position. It is done after you are given a medicine to make you fall asleep (general anesthetic). The joint may also be injected with salt water at high pressure to break down scarring. 6. Surgery. This may be done in severe cases when other treatments have failed. Although most people recover completely from adhesive capsulitis, some may  not regain the full movement of the shoulder. HOME CARE INSTRUCTIONS 1. Take over-the-counter and prescription medicines only as told by your health care provider. 2. If you are being treated with physical therapy, follow instructions from your physical therapist. 3. Avoid exercises that put a lot of demand on your shoulder, such as throwing. These exercises can make pain worse. 4. If directed, apply ice to the injured area: 1. Put ice in a plastic bag. 2. Place a towel between your skin and the bag. 3. Leave the ice on for 20 minutes, 2-3 times per day. SEEK MEDICAL CARE IF: 1. You develop new symptoms. 2. Your symptoms get worse.   This information is not intended to replace advice given to you by your health care provider. Make sure you discuss any questions you have with your health care provider.   Document Released: 03/20/2009 Document Revised: 02/11/2015 Document Reviewed: 09/15/2014 Elsevier Interactive Patient Education 2016 [employer] Inc.  Shoulder Range of Motion Exercises Shoulder range of motion (ROM) exercises are designed to keep the shoulder moving freely. They are often recommended for people who have shoulder pain. MOVEMENT EXERCISE When you are able, do this exercise 5-6 days per week, or as told by your health care provider. Work toward doing 2 sets of 10 swings. Pendulum Exercise How To Do This Exercise Lying Down 5. Lie face-down on a bed with your abdomen close to the side of the bed. 6. Let your arm hang over the side of the bed. 7. Relax your shoulder, arm, and hand. 8. Slowly and gently swing your arm forward and back. Do not use your neck  muscles to swing your arm. They should be relaxed. If you are struggling to swing your arm, have someone gently swing it for you. When you do this exercise for the first time, swing your arm at a 15 degree angle for 15 seconds, or swing your arm 10 times. As pain lessens over time, increase the angle of the swing to 30-45  degrees. 9. Repeat steps 1-4 with the other arm. How To Do This Exercise While Standing 3. Stand next to a sturdy chair or table and hold on to it with your hand.  Bend forward at the waist.  Bend your knees slightly.  Relax your other arm and let it hang limp.  Relax the shoulder blade of the arm that is hanging and let it drop.  While keeping your shoulder relaxed, use body motion to swing your arm in small circles. The first time you do this exercise, swing your arm for about 30 seconds or 10 times. When you do it next time, swing your arm for a little longer.  Stand up tall and relax.  Repeat steps 1-7, this time changing the direction of the circles. 4. Repeat steps 1-8 with the other arm. STRETCHING EXERCISES Do these exercises 3-4 times per day on 5-6 days per week or as told by your health care provider. Work toward holding the stretch for 20 seconds. Stretching Exercise 1 5. Lift your arm straight out in front of you. 6. Bend your arm 90 degrees at the elbow (right angle) so your forearm goes across your body and looks like the letter "L." 7. Use your other arm to gently pull the elbow forward and across your body. 8. Repeat steps 1-3 with the other arm. Stretching Exercise 2 You will need a towel or rope for this exercise. 7. Bend one arm behind your back with the palm facing outward. 8. Hold a towel with your other hand. 9. Reach the arm that holds the towel above your head, and bend that arm at the elbow. Your wrist should be behind your neck. 10. Use your free hand to grab the free end of the towel. 11. With the higher hand, gently pull the towel up behind you. 12. With the lower hand, pull the towel down behind you. 13. Repeat steps 1-6 with the other arm. STRENGTHENING EXERCISES Do each of these exercises at four different times of day (sessions) every day or as told by your health care provider. To begin with, repeat each exercise 5 times (repetitions). Work toward  doing 3 sets of 12 repetitions or as told by your health care provider. Strengthening Exercise 1 You will need a light weight for this activity. As you grow stronger, you may use a heavier weight. 5. Standing with a weight in your hand, lift your arm straight out to the side until it is at the same height as your shoulder. 6. Bend your arm at 90 degrees so that your fingers are pointing to the ceiling. 7. Slowly raise your hand until your arm is straight up in the air. 8. Repeat steps 1-3 with the other arm. Strengthening Exercise 2 You will need a light weight for this activity. As you grow stronger, you may use a heavier weight. 3. Standing with a weight in your hand, gradually move your straight arm in an arc, starting at your side, then out in front of you, then straight up over your head. 4. Gradually move your other arm in an arc, starting at your side,  then out in front of you, then straight up over your head. 5. Repeat steps 1-2 with the other arm. Strengthening Exercise 3 You will need an elastic band for this activity. As you grow stronger, gradually increase the size of the bands or increase the number of bands that you use at one time. 1. While standing, hold an elastic band in one hand and raise that arm up in the air. 2. With your other hand, pull down the band until that hand is by your side. 3. Repeat steps 1-2 with the other arm.   This information is not intended to replace advice given to you by your health care provider. Make sure you discuss any questions you have with your health care provider.   Document Released: 02/19/2003 Document Revised: 10/07/2014 Document Reviewed: 05/19/2014 Elsevier Interactive Patient Education [employer]2016 Else[employer] Inc[employer]

## 2016-07-17 IMAGING — DX DG CHEST 2V
2 series · 2 of 2 positions shown · non-contrast
Comparison: October 20, 2013

CLINICAL DATA: Cough for 1 month. Chest pain and difficulty
breathing

EXAM:
CHEST  2 VIEW

[chest pa]
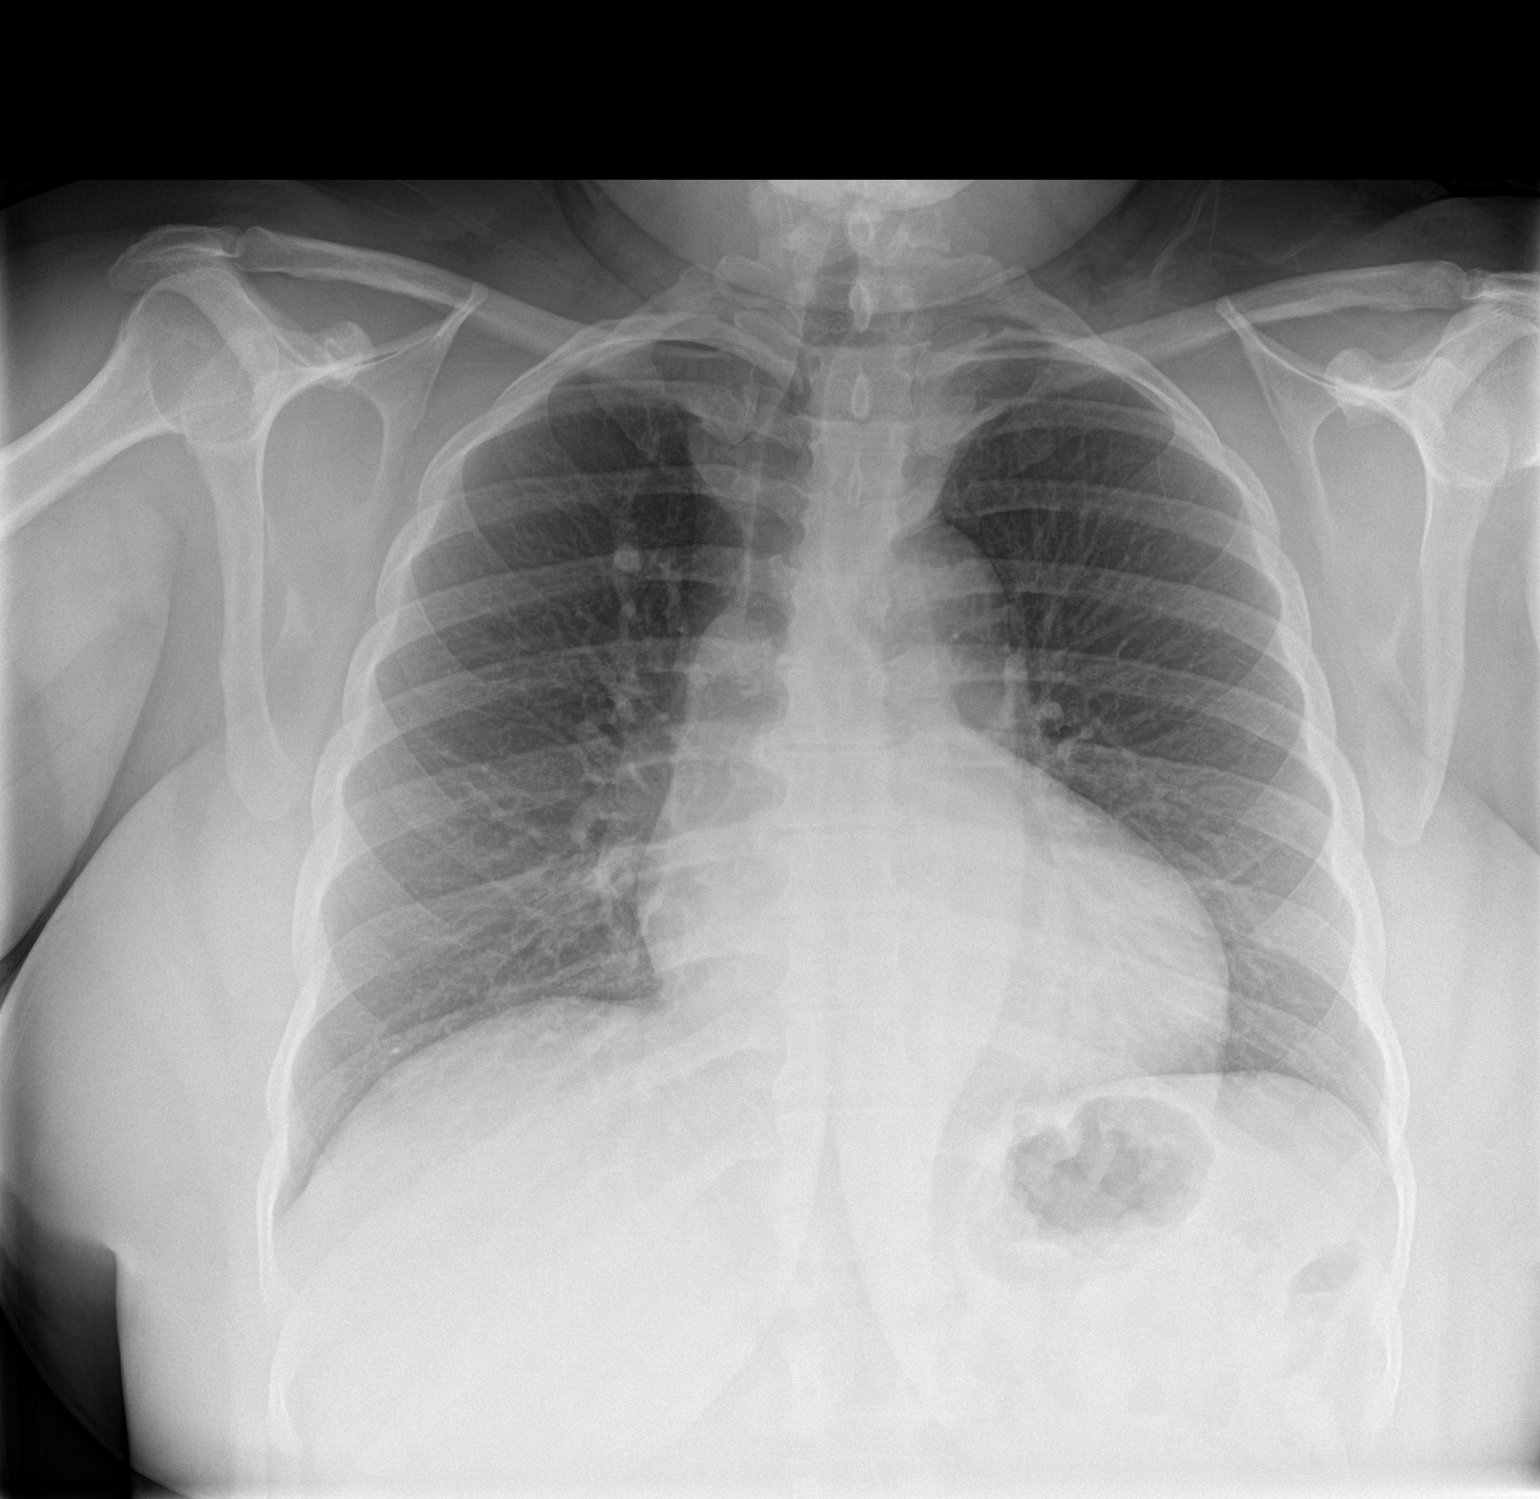

[chest lat]
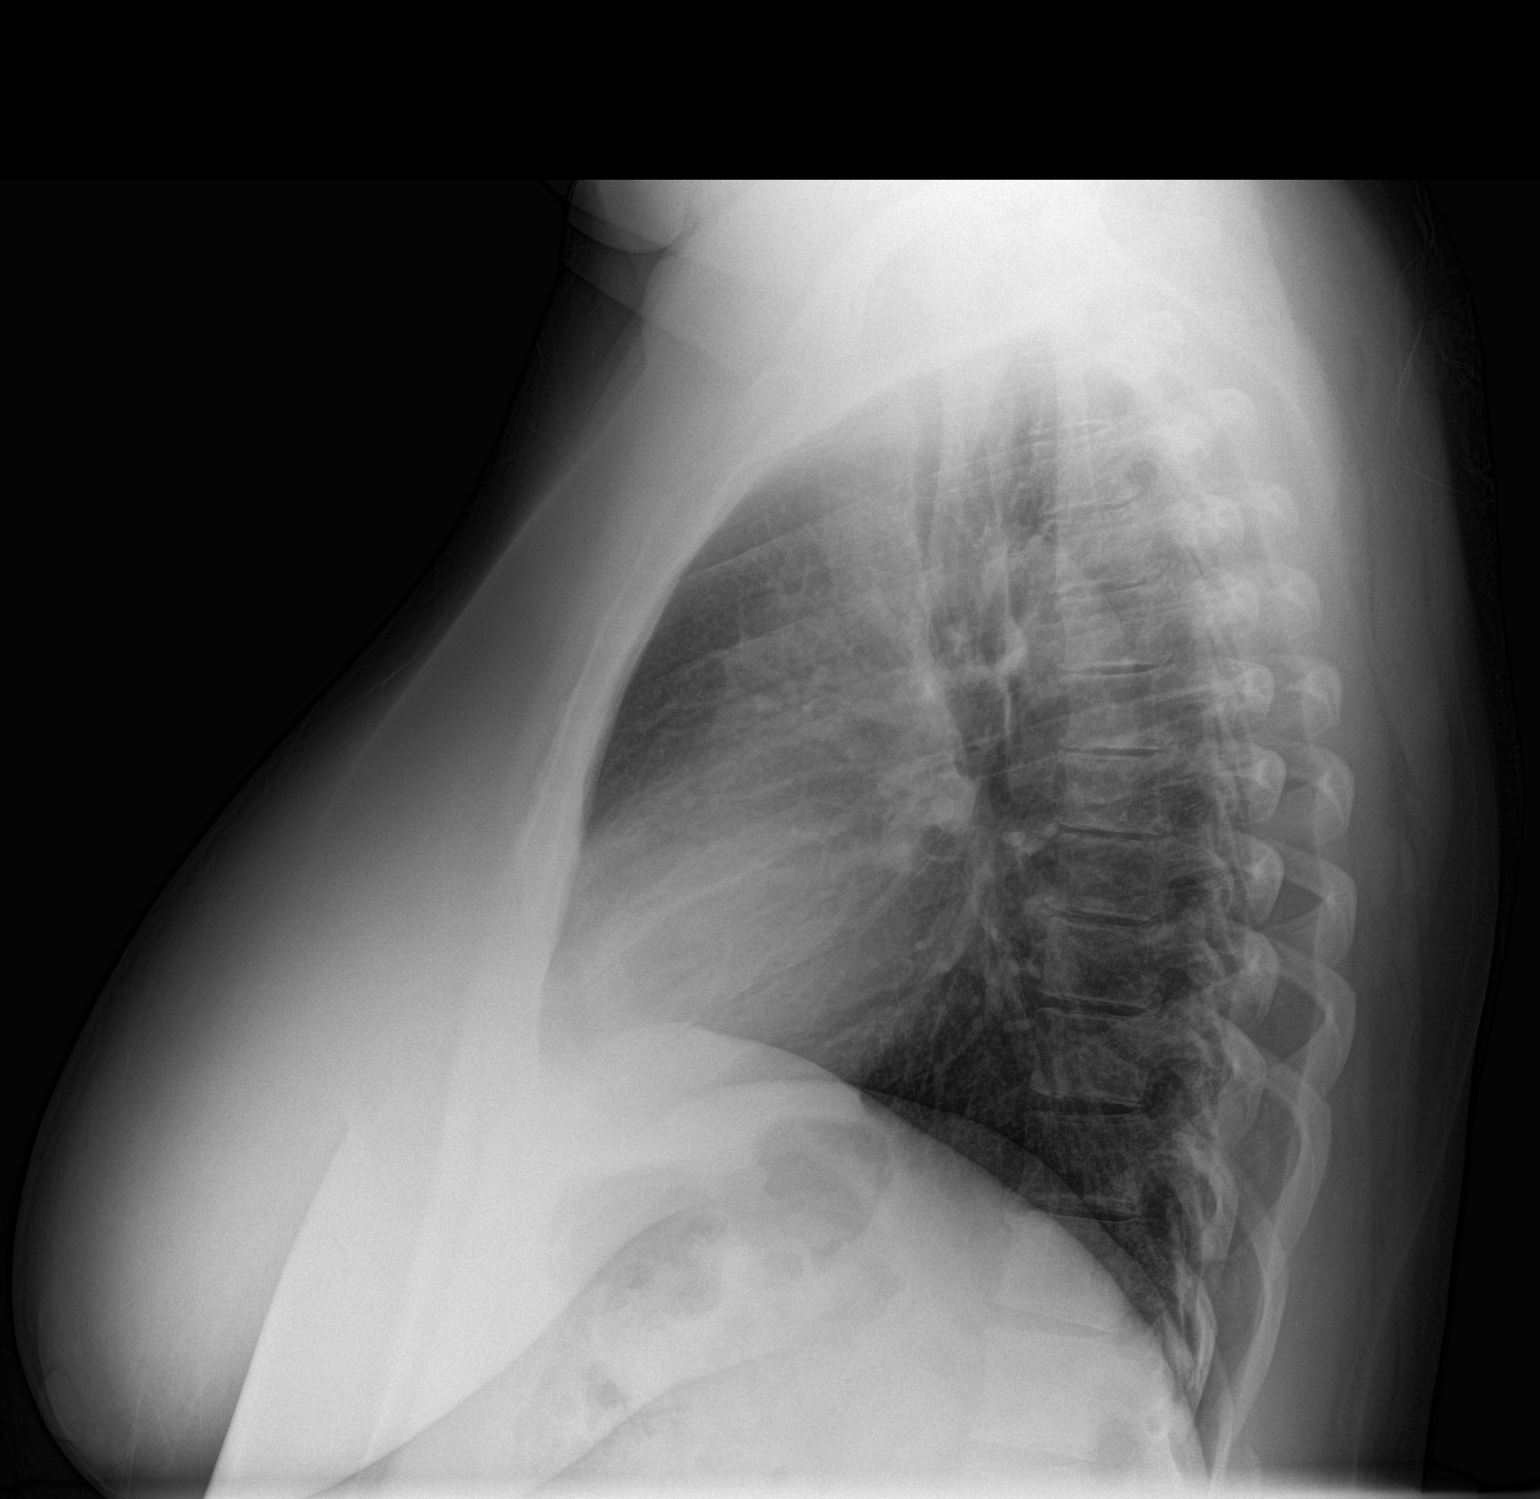

[2 of 2 positions shown; findings below may reference images not displayed]

FINDINGS: There is a granuloma in the right upper lobe. There is no edema or
consolidation. Heart size and pulmonary vascularity are within
normal limits. No adenopathy. No pneumothorax. No bone lesions.
IMPRESSION: Granuloma right upper lobe.  No lung edema or consolidation.

## 2018-04-30 ENCOUNTER — Other Ambulatory Visit: Payer: Self-pay

## 2018-04-30 ENCOUNTER — Encounter (HOSPITAL_BASED_OUTPATIENT_CLINIC_OR_DEPARTMENT_OTHER): Payer: Self-pay | Admitting: Emergency Medicine

## 2018-04-30 ENCOUNTER — Emergency Department (HOSPITAL_BASED_OUTPATIENT_CLINIC_OR_DEPARTMENT_OTHER)
Admission: EM | Admit: 2018-04-30 | Discharge: 2018-04-30 | Disposition: A | Discharge status: Home / Self Care | Payer: Medicaid Other | Attending: Emergency Medicine | Admitting: Emergency Medicine

## 2018-04-30 ENCOUNTER — Emergency Department (HOSPITAL_BASED_OUTPATIENT_CLINIC_OR_DEPARTMENT_OTHER): Payer: Medicaid Other

## 2018-04-30 DIAGNOSIS — Y9389 Activity, other specified: Secondary | ICD-10-CM | POA: Diagnosis not present

## 2018-04-30 DIAGNOSIS — S161XXA Strain of muscle, fascia and tendon at neck level, initial encounter: Secondary | ICD-10-CM | POA: Diagnosis not present

## 2018-04-30 DIAGNOSIS — F419 Anxiety disorder, unspecified: Secondary | ICD-10-CM | POA: Diagnosis not present

## 2018-04-30 DIAGNOSIS — Z79899 Other long term (current) drug therapy: Secondary | ICD-10-CM | POA: Insufficient documentation

## 2018-04-30 DIAGNOSIS — I10 Essential (primary) hypertension: Secondary | ICD-10-CM | POA: Diagnosis not present

## 2018-04-30 DIAGNOSIS — Y999 Unspecified external cause status: Secondary | ICD-10-CM | POA: Insufficient documentation

## 2018-04-30 DIAGNOSIS — R52 Pain, unspecified: Secondary | ICD-10-CM

## 2018-04-30 DIAGNOSIS — Y929 Unspecified place or not applicable: Secondary | ICD-10-CM | POA: Diagnosis not present

## 2018-04-30 DIAGNOSIS — X58XXXA Exposure to other specified factors, initial encounter: Secondary | ICD-10-CM | POA: Insufficient documentation

## 2018-04-30 DIAGNOSIS — S199XXA Unspecified injury of neck, initial encounter: Secondary | ICD-10-CM | POA: Diagnosis present

## 2018-04-30 MED ORDER — CAMPHOR-MENTHOL-METHYL SAL 1.2-5.7-6.3 % EX PTCH: 1.0000 | MEDICATED_PATCH | Freq: Every day | CUTANEOUS | 0 refills | Status: DC

## 2018-04-30 NOTE — ED Notes (Signed)
ED Provider at bedside. 

## 2018-04-30 NOTE — Discharge Instructions (Signed)
Please see the information and instructions below regarding your visit.  Your diagnoses today include:  1. Pain   2. Strain of neck muscle, initial encounter     Tests performed today include: See side panel of your discharge paperwork for testing performed today. Vital signs are listed at the bottom of these instructions.   Please apply Salon PAS to your back.   Medications prescribed:    Take any prescribed medications only as prescribed, and any over the counter medications only as directed on the packaging.  Home care instructions:  Please follow any educational materials contained in this packet.   Follow-up instructions: Please follow-up with your primary care provider as soon as possible for further evaluation of your symptoms if they are not completely improved.   Please follow up with Dr. Pearletha ForgeHudnall of orthopedics.  Return instructions:  Please return to the Emergency Department if you experience worsening symptoms.  Please return the emergency department for any weakness or numbness in your extremities, fever with neck pain, or worsening symptoms. Please return if you have any other emergent concerns.  Additional Information:   Your vital signs today were: BP (!) 170/101    Pulse (!) 107    Temp 98.3 F (36.8 C)    Resp 18    Ht 5\' 2"  (1.575 m)    Wt 100.7 kg    SpO2 98%    BMI 40.60 kg/m  If your blood pressure (BP) was elevated on multiple readings during this visit above 130 for the top number or above 80 for the bottom number, please have this repeated by your primary care provider within one month. --------------  Thank you for allowing us to participate in your care today.

## 2018-04-30 NOTE — ED Provider Notes (Signed)
[facility] EMERGENCY DEPARTMENT Provider Note   CSN: [id number] Arrival date & time: 04/30/18  2001     History   Chief Complaint Chief Complaint  Patient presents with  . Neck Pain    HPI Phyllis HughsBertha Peral is a 57 y.o. female.  HPI  Patient is a 57 year old female with a history of anxiety, hypertension, and hypothyroidism presenting for neck pain and bilateral shoulder pain.  She reports it is been persistent for 4 weeks.  She reports that she saw her primary care provider last week and she was prescribed a muscle relaxant, cyclobenzaprine, however it is not provided relief.  She reports that the pain is at all times with either movement or rest.  She denies any exacerbating factors.  She states that she recalls the day that it hurting 4 weeks ago when she was outside taking her car to the shop.  She thought it was "the cold" that was exacerbating her arthritis.  Patient denies any weakness or numbness in bilateral upper or lower extremities, saddle anesthesia, loss of bowel or bladder control, fever or chills, history of cancer, history of IVDU.  Patient denies shortness of breath, chest pain, or pleuritic pain.  She does not smoke.  Patient reports that no medicines have prescribed her pain.  She denies any other medications for her symptoms.  Past Medical History:  Diagnosis Date  . Anxiety   . Hypertension   . Thyroid disease     Patient Active Problem List   Diagnosis Date Noted  . Low back pain 02/23/2015  . Anxiety 01/27/2015  . Clinical depression 01/27/2015  . BP (high blood pressure) 01/27/2015  . Disease of thyroid gland 01/27/2015  . Pain in shoulder 11/11/2014    Past Surgical History:  Procedure Laterality Date  . ABDOMINAL HYSTERECTOMY    . ROTATOR CUFF REPAIR       OB History   None      Home Medications    Prior to Admission medications   Medication Sig Start Date End Date Taking? Authorizing Provider  ALPRAZolam Prudy Feeler(XANAX) 1 MG tablet  TAKE one tablet BY MOUTH TWO TIMES DAILY AS NEEDED for sleep 01/08/15   [provider]  alprazolam Prudy Feeler(XANAX) 2 MG tablet Take 1 tablet (2 mg total) by mouth at bedtime as needed for sleep. 08/03/14   Rolan BuccoBelfi, Melanie, MD  Beclomethasone Dipropionate (QNASL) 80 MCG/ACT AERS Place 4 sprays into the nose. 10/01/12   [provider]  FLUoxetine (PROZAC) 20 MG capsule Take 20 mg by mouth. 04/16/14 04/16/15  [provider]  furosemide (LASIX) 40 MG tablet Take 40 mg by mouth. 11/11/14 11/11/15  [provider]  levothyroxine (SYNTHROID, LEVOTHROID) 75 MCG tablet Take 75 mcg by mouth daily before breakfast.    [provider]  lisinopril (PRINIVIL,ZESTRIL) 10 MG tablet Take 1 tablet (10 mg total) by mouth daily. 04/16/14   [provider]  meloxicam (MOBIC) 15 MG tablet Take 1 tablet (15 mg total) by mouth daily. 02/18/15   Hudnall, Azucena FallenShane R, MD  methocarbamol (ROBAXIN) 500 MG tablet Take 1 tablet (500 mg total) by mouth every 6 (six) hours as needed for muscle spasms. 04/10/15   Fayrene Helperran, Bowie, PA-C  naproxen (NAPROSYN) 500 MG tablet Take 1 tablet (500 mg total) by mouth 2 (two) times daily. 04/10/15   Fayrene Helperran, Bowie, PA-C  metoCLOPramide (REGLAN) 10 MG tablet Take 1 tablet (10 mg total) by mouth every 6 (six) hours as needed for nausea (nausea/headache). 10/20/13 01/21/15  Wayland Salinas, MD  sertraline (ZOLOFT) 100 MG tablet Take 100 mg by mouth daily.  01/21/15  [provider]    Family History Family History  Problem Relation Age of Onset  . Stroke Mother   . Cancer Mother     Social History Social History   Tobacco Use  . Smoking status: Never Smoker  . Smokeless tobacco: Never Used  Substance Use Topics  . Alcohol use: No    Alcohol/week: 0.0 standard drinks  . Drug use: No     Allergies   Patient has no known allergies.   Review of Systems Review of Systems  Constitutional: Negative for chills and fever.  Respiratory: Negative for  shortness of breath.   Cardiovascular: Negative for chest pain.  Gastrointestinal: Negative for abdominal pain, nausea and vomiting.  Musculoskeletal: Positive for arthralgias and neck pain. Negative for back pain, gait problem and neck stiffness.  Neurological: Negative for weakness and numbness.     Physical Exam Updated Vital Signs There were no vitals taken for this visit.  Physical Exam  Constitutional: She appears well-developed and well-nourished. No distress.  Sitting comfortably in bed.  HENT:  Head: Normocephalic and atraumatic.  Eyes: Conjunctivae are normal. Right eye exhibits no discharge. Left eye exhibits no discharge.  EOMs normal to gross examination.  Neck: Normal range of motion. Neck supple.  Cardiovascular: Normal rate and regular rhythm.  Intact, 2+ radial pulse bilaterally.  Pulmonary/Chest: Effort normal and breath sounds normal.  Normal respiratory effort. Patient converses comfortably. No audible wheeze or stridor.  Abdominal: She exhibits no distension.  Musculoskeletal: Normal range of motion. She exhibits no deformity.  PALPATION: No midline but paraspinal musculature tenderness of cervical and thoracic spine. ROM of cervical spine intact with flexion/extension/lateral flexion/lateral rotation; Patient can laterally rotate cervical spine greater than 45 degrees.  MOTOR: 5/5 strength b/l with resisted shoulder abduction/adduction, biceps flexion (C5/6), biceps extension (C6-C8), wrist flexion, wrist extension (C6-C8), and grip strength (C7-T1) SENSORY: Sensation is intact to light touch in:  Superficial radial nerve distribution (dorsal first web space) Median nerve distribution (tip of index finger)   Ulnar nerve distribution (tip of small finger)  Patient moves LEs symmetrically and with good coordination. Patient ambulates symmetrically with no evidence of LE weakness.  Normal and symmetric gait. Hoffmann sign negative bilaterally.  Neurological: She  is alert.  Cranial nerves intact to gross observation. Patient moves extremities without difficulty.  Skin: Skin is warm and dry. She is not diaphoretic.  Psychiatric: She has a normal mood and affect. Her behavior is normal. Judgment and thought content normal.  Nursing note and vitals reviewed.    ED Treatments / Results  Labs (all labs ordered are listed, but only abnormal results are displayed) Labs Reviewed - No data to display  EKG None  Radiology Dg Cervical Spine 2 Or 3 Views  Result Date: 04/30/2018 CLINICAL DATA:  Cervical neck pain for 4 weeks.  No known injury. EXAM: CERVICAL SPINE - 2-3 VIEW COMPARISON:  Radiographs 08/12/2015 FINDINGS: Cervical spine alignment is maintained. Disc space narrowing with endplate spurring at C5-C6, not significantly changed from prior exam. Vertebral body heights are preserved. The dens is intact. Posterior elements appear well-aligned. AP view slightly obscured by overlying artifact. There is no evidence of fracture. No prevertebral soft tissue edema. Rightward tracheal deviation is unchanged from prior exam. IMPRESSION: Degenerative disc disease at C5-C6, not significantly changed from 2017 radiographs. No evidence of acute osseous abnormality. Electronically Signed   By: Shawna Orleans  Sanford M.D.   On: 04/30/2018 23:09    Procedures Procedures (including critical care time)  Medications Ordered in ED Medications - No data to display   Initial Impression / Assessment and Plan / ED Course  I have reviewed the triage vital signs and the nursing notes.  Pertinent labs & imaging results that were available during my care of the patient were reviewed by me and considered in my medical decision making (see chart for details).     Patient is nontoxic-appearing, afebrile, neurologically intact in the cervical spine distribution.  Examination consistent with cervical strain of trapezius or scalene muscles.  Patient has no high risk features for  her pain such as fever, high risk history, or neurologic symptoms.  Also doubt pulmonary cause, as patient has normal lung exam, no pleuritic nature to the pain, and stable vital signs.  Radiographs were obtained, which demonstrate no evidence of lytic lesions.  Stable degenerative disease since 2017.  Patient previously benefit from physical therapy, and placed ambulatory referral to [facility] clinic.    Patient did have elevated blood pressure during visit today.  No signs or symptoms of hypertensive urgency or emergency.   Patient denied any controlled medications and discussed the possibility of Valium if Robaxin or Flexeril were not working.  [place] controlled substance database was reviewed, and it appears that patient receives regular prescriptions for Norco and Xanax.  When asked about use of these medications, patient did report that she takes his medications regularly.  I discussed with patient that adding an additional controlled substance would not be an option, due to the risk of interactions.  At this point, patient became agitated regarding her ED visit, and stated that the healthcare providers taking care of her were accusing her of being "a drug user".  I discussed with the patient that limiting controlled substances is for safety, and I recommended following up with the primary provider that is prescribing them to further manage pain.  Additionally, I assured the patient that she was given orthopedic and an ambulatory referral to physical therapy.  Return precautions were given to patient for any worsening pain, weakness or numbness in extremities, or fever with neck pain.  Patient is in understanding and stayed to receive paperwork for visit.  Final Clinical Impressions(s) / ED Diagnoses   Final diagnoses:  Strain of neck muscle, initial encounter    ED Discharge Orders         Ordered    Ambulatory referral to Physical Therapy     04/30/18 2326    Camphor-Menthol-Methyl  Sal 1.2-5.7-6.3 % [facility]  Daily     04/30/18 2343           Elisha Ponder, PA-C 04/30/18 2358    Sabas Sous, MD 05/01/18 0040

## 2018-04-30 NOTE — ED Triage Notes (Signed)
Pt is c/o neck and bilateral shoulder pain  Pt states she was out in the cold about 4 weeks ago and it tensed up  Pt states she went to the doctor and he gave her a  muscle relaxer but it will not release

## 2018-05-16 ENCOUNTER — Ambulatory Visit: Payer: Medicaid Other | Attending: Family | Admitting: Physical Therapy

## 2018-05-16 ENCOUNTER — Encounter: Payer: Self-pay | Admitting: Physical Therapy

## 2018-05-16 DIAGNOSIS — M542 Cervicalgia: Secondary | ICD-10-CM | POA: Insufficient documentation

## 2018-05-16 NOTE — Patient Instructions (Signed)
Access Code: [email or web address]  URL: [email or web address]  Date: 05/16/2018  Prepared by: Ivery QualeBrian Nelson   Exercises  Seated Cervical Sidebending Stretch - 3 sets - 30 hold - 2x daily - 6x weekly  Seated Cervical Retraction - 10 reps - 3 sets - 2x daily - 6x weekly  Doorway Pec Stretch at 90 Degrees Abduction - 3 sets - 30 hold - 2x daily - 6x weekly  Cervical Extension AROM with Strap - 10 reps - 1-2 sets - 5 hold - 2x daily - 6x weekly  Seated Assisted Cervical Rotation with Towel - 10 reps - 1-2 sets - 5 hold - 2x daily - 6x weekly

## 2018-05-16 NOTE — Therapy (Signed)
[facility] [address]  [address] [place], [place], [address] Phone: [phone]   Fax:  [phone]  Physical Therapy Evaluation  Patient Details  Name: Phyllis Moore MRN: [id number] Date of Birth: [birth year] Referring Provider (PT): Aviva Kluver PA-C   Encounter Date: 05/16/2018  PT End of Session - 05/16/18 1633    Visit Number  1    Number of Visits  4    Date for PT Re-Evaluation  06/13/18    Authorization Type  MCD     PT Start Time  1445    PT Stop Time  1530    PT Time Calculation (min)  45 min    Activity Tolerance  Patient tolerated treatment well    Behavior During Therapy  [facility] for tasks assessed/performed       Past Medical History:  Diagnosis Date  . Anxiety   . Hypertension   . Thyroid disease     Past Surgical History:  Procedure Laterality Date  . ABDOMINAL HYSTERECTOMY    . ROTATOR CUFF REPAIR      There were no vitals filed for this visit.   Subjective Assessment - 05/16/18 1452    Subjective  Pt relays neck pain and tightness over the last 8 weeks. She is taking meds for this but this is not improving. She had neck x-ray which show "some DDD that has not changed from 2017,  No evidence of acute osseous abnormality." She denies daily headaches, or dizziness but does complain of fatique and fibromyalgia pain.     Pertinent History  PMH:anx,HTN,hypothroid, RTC surgery April 2019    Diagnostic tests  x-rays    Currently in Pain?  Yes    Pain Score  7    7-10   Pain Location  Neck    Pain Orientation  Right;Left;Mid    Pain Descriptors / Indicators  Aching;Burning;Sharp;Tightness    Pain Type  Acute pain    Pain Radiating Towards  into shoulders    Pain Onset  More than a month ago    Pain Frequency  Constant    Aggravating Factors   rotating, looking up    Multiple Pain Sites  No         [facility] PT Assessment - 05/16/18 0001      Assessment   Medical Diagnosis  Cervical Strain     Referring Provider (PT)  Aviva Kluver PA-C    Onset Date/Surgical Date  --   8 week onset of pain   Hand Dominance  Right    Prior Therapy  PT for shoulder      Precautions   Precautions  None      Balance Screen   Has the patient fallen in the past 6 months  No      Home Environment   Living Environment  Private residence      Prior Function   Level of Independence  Independent with basic ADLs    Vocation  Unemployed      Cognition   Behaviors  Restless   anxious, crying at times during eval      Observation/Other Assessments   Focus on Therapeutic Outcomes (FOTO)   not done, MCD      Sensation   Light Touch  Appears Intact   sensitive to light touch     Posture/Postural Control   Posture Comments  rounded shoulders      ROM / Strength   AROM / PROM /  Strength  AROM;Strength      AROM   AROM Assessment Site  Cervical    Cervical Flexion  50%    Cervical Extension  50%    Cervical - Right Side Bend  50%    Cervical - Left Side Bend  50%    Cervical - Right Rotation  50%    Cervical - Left Rotation  50%      Strength   Overall Strength Comments  UE strength 5/5 except shoulder flexion and abd 4/5 on Rt and 5/5 on Lt      Flexibility   Soft Tissue Assessment /Muscle Length  --   tight UT, pecs, P.S.     Palpation   Spinal mobility  difficult to assess due to not being able to tolerate much pressure      Special Tests   Other special tests  +spurlings test                Objective measurements completed on examination: See above findings.      [facility] Adult PT Treatment/Exercise - 05/16/18 0001      Modalities   Modalities  Electrical Stimulation;Moist Heat      Moist Heat Therapy   Number Minutes Moist Heat  10 Minutes    Moist Heat Location  Cervical      Electrical Stimulation   Electrical Stimulation Location  neck    Electrical Stimulation Action  IFC    Electrical Stimulation Parameters  tolerance 80-150hz     Electrical  Stimulation Goals  Pain             PT Education - 05/16/18 1632    Education Details  HEP,POC,TENS    Person(s) Educated  Patient    Methods  Explanation;Demonstration;Verbal cues;Handout    Comprehension  Verbalized understanding;Need further instruction       PT Short Term Goals - 05/16/18 1646      PT SHORT TERM GOAL #1   Title  Pt will be I and compliant with HEP. 3 weeks     Baseline  no HEP until today    Status  New        PT Long Term Goals - 05/16/18 1647      PT LONG TERM GOAL #1   Title  Pt will improve neck ROM to [facility] (at least 75%). 4 weeks 06/13/18    Baseline  50%     Status  New      PT LONG TERM GOAL #2   Title  Pt will decrease overall pain to less than 4/10 overall with ususal activity. 4 weeks 06/13/18    Baseline  7-10 out of 10    Status  New      PT LONG TERM GOAL #3   Title  Pt will be able to drive with less complaints of neck pain and tightness. 4 weeks 06/13/18    Baseline  very limited driving due to pain and tightness.     Status  New             Plan - 05/16/18 1639    Clinical Impression Statement  Pt presents with subacute neck pain that radiates into her shoulders and is getting worse over the last 8 weeks. She has tight guarded muscles, increased sensitivity to light touch, decreased neck and shoulder ROM, decreased strength, poor posture and increased pain limiting her function. She has history of chronic pain and fibromyalgia as well as long term med use. Psychosocial  factors likely influincing her pain and dysfunction and she was very emotional during session crying at several times.     Clinical Presentation  Evolving    Clinical Presentation due to:  worsening pain     Clinical Decision Making  Moderate    Rehab Potential  Good    Clinical Impairments Affecting Rehab Potential  chronic pain syndromes, psychosocial factors    PT Frequency  1x / week    PT Duration  4 weeks    PT Treatment/Interventions   Cryotherapy;Electrical Stimulation;Iontophoresis 4mg /ml Dexamethasone;Moist Heat;Traction;Ultrasound;Therapeutic activities;Therapeutic exercise;Neuromuscular re-education;Passive range of motion;Dry needling;Taping;Spinal Manipulations;Joint Manipulations    PT Next Visit Plan  review HEP, pain education, gentle ROM and graded exercise, modalties as needed    Consulted and Agree with Plan of Care  Patient       Patient will benefit from skilled therapeutic intervention in order to improve the following deficits and impairments:  Decreased activity tolerance, Decreased endurance, Decreased range of motion, Decreased strength, Impaired flexibility, Improper body mechanics, Pain, Postural dysfunction, Increased muscle spasms  Visit Diagnosis: Cervicalgia     Problem List Patient Active Problem List   Diagnosis Date Noted  . Low back pain 02/23/2015  . Anxiety 01/27/2015  . Clinical depression 01/27/2015  . BP (high blood pressure) 01/27/2015  . Disease of thyroid gland 01/27/2015  . Pain in shoulder 11/11/2014    April Manson, PT,DPT 05/16/2018, 4:51 PM  [facility] [address]  [address] [place], [place], [address] Phone: [phone]   Fax:  [phone]  Name: Phyllis Moore MRN: [id number] Date of Birth: 1961/03/20

## 2018-05-23 ENCOUNTER — Ambulatory Visit: Payer: Medicaid Other | Admitting: Physical Therapy

## 2018-05-28 ENCOUNTER — Ambulatory Visit: Payer: Medicaid Other | Admitting: Physical Therapy

## 2018-05-28 ENCOUNTER — Encounter: Payer: Self-pay | Admitting: Physical Therapy

## 2018-05-28 DIAGNOSIS — M542 Cervicalgia: Secondary | ICD-10-CM | POA: Diagnosis not present

## 2018-05-28 NOTE — Therapy (Signed)
[facility] [place] [address]  [address] [place], [place], [address] Phone: [phone]   Fax:  [phone]  Physical Therapy Treatment  Patient Details  Name: Phyllis HughsBertha Barren MRN: [id number] Date of Birth: 01/17/1961 Referring Provider (PT): Aviva KluverAlyssa Murray PA-C   Encounter Date: 05/28/2018  PT End of Session - 05/28/18 1449    Visit Number  2    Number of Visits  4    Date for PT Re-Evaluation  06/13/18    Authorization Type  MCD     Authorization Time Period  05/19/18 - 06/01/18    Authorization - Visit Number  1    Authorization - Number of Visits  2    PT Start Time  1449    PT Stop Time  1550    PT Time Calculation (min)  61 min    Activity Tolerance  Patient tolerated treatment well    Behavior During Therapy  [facility] for tasks assessed/performed       Past Medical History:  Diagnosis Date  . Anxiety   . Hypertension   . Thyroid disease     Past Surgical History:  Procedure Laterality Date  . ABDOMINAL HYSTERECTOMY    . ROTATOR CUFF REPAIR      There were no vitals filed for this visit.  Subjective Assessment - 05/28/18 1453    Subjective  Pt reporting limited complaince with HEP.    Pertinent History  PMH:anx,HTN,hypothroid, RTC surgery April 2019    Diagnostic tests  x-rays    Currently in Pain?  Yes    Pain Score  7    7-8/10   Pain Location  Neck    Pain Orientation  Lower    Pain Descriptors / Indicators  Tingling   "pulling", "nervy"   Pain Type  Acute pain    Pain Radiating Towards  into B shoulders    Pain Onset  More than a month ago    Pain Frequency  Constant                       [facility] Adult PT Treatment/Exercise - 05/28/18 1449      Exercises   Exercises  Neck      Neck Exercises: Theraband   Shoulder Extension  10 reps   yellow TB   Shoulder Extension Limitations  standing - 5" hold    Rows  10 reps   yellow TB   Rows Limitations  standing - 5" hold    Shoulder  External Rotation  10 reps   yellow TB   Shoulder External Rotation Limitations  supine + scap retraction; 5" hold    Horizontal ABduction  10 reps   yellow TB   Horizontal ABduction Limitations  supine + scap retraction; 5" hold      Neck Exercises: Seated   Neck Retraction  10 reps;5 secs    Cervical Rotation  Right;Left;10 reps    Cervical Rotation Limitations  SNAG with towel    Other Seated Exercise  Cervical extension with towel x10      Neck Exercises: Supine   Neck Retraction  10 reps;5 secs    Neck Retraction Limitations  into pillow      Modalities   Modalities  Electrical Stimulation;Moist Heat      Moist Heat Therapy   Number Minutes Moist Heat  15 Minutes    Moist Heat Location  Cervical   seated     Electrical  Stimulation   Electrical Stimulation Location  B cervical parapsinals & UT    Electrical Stimulation Action  IFC    Electrical Stimulation Parameters  80-150 Hz, intensity to pt tol x 15"    Electrical Stimulation Goals  Pain      Manual Therapy   Manual Therapy  Soft tissue mobilization;Myofascial release;Manual Traction;Passive ROM    Manual therapy comments  supine    Soft tissue mobilization  STM to B cervical paraspinals, suboccipitals, SCM, UT & LS    Myofascial Release  B suboccipital release, manual TPR R UT    Passive ROM  gentle cervical PROM all directions    Manual Traction  gentle cervical distraction 3 x 30 sec      Neck Exercises: Stretches   Upper Trapezius Stretch  Right;Left;30 seconds;1 rep    Upper Trapezius Stretch Limitations  cues to avoid shoulder hike    Corner Stretch  30 seconds;1 rep    Corner Stretch Limitations  low doorway stretch (unable to tolerate mid or high position due R shoulder)             PT Education - 05/28/18 1532    Education Details  HEP update    Person(s) Educated  Patient    Methods  Explanation;Demonstration;Handout    Comprehension  Verbalized understanding;Returned demonstration;Need  further instruction       PT Short Term Goals - 05/28/18 1530      PT SHORT TERM GOAL #1   Title  Pt will be I and compliant with HEP. 3 weeks     Baseline  no HEP until today    Status  On-going        PT Long Term Goals - 05/28/18 1550      PT LONG TERM GOAL #1   Title  Pt will improve neck ROM to [facility] (at least 75%). 4 weeks 06/13/18    Baseline  50%     Status  On-going      PT LONG TERM GOAL #2   Title  Pt will decrease overall pain to less than 4/10 overall with ususal activity. 4 weeks 06/13/18    Baseline  7-10 out of 10    Status  On-going      PT LONG TERM GOAL #3   Title  Pt will be able to drive with less complaints of neck pain and tightness. 4 weeks 06/13/18    Baseline  very limited driving due to pain and tightness.     Status  On-going            Plan - 05/28/18 1532    Clinical Impression Statement  Jacqulene reporting limited compliance with HEP and requiring signficant cueing/repeat instruction upon review - unable to tolerate mid level position with doorway stretch, therefore changed stretch to low postion. Remainder of session focusing on manual STM/MFR and therapeutic exercises focusing on postural strengtheing with HEP updated at pt request. Treatment concluded with estim and moist heat, with pt noting pain "much better" by end of session.    Rehab Potential  Good    Clinical Impairments Affecting Rehab Potential  chronic pain syndromes, psychosocial factors    PT Frequency  1x / week    PT Duration  4 weeks    PT Treatment/Interventions  Cryotherapy;Electrical Stimulation;Iontophoresis 4mg /ml Dexamethasone;Moist Heat;Traction;Ultrasound;Therapeutic activities;Therapeutic exercise;Neuromuscular re-education;Passive range of motion;Dry needling;Taping;Spinal Manipulations;Joint Manipulations    PT Next Visit Plan  review HEP, pain education, gentle ROM and graded exercise, modalties as needed  Consulted and Agree with Plan of Care  Patient        Patient will benefit from skilled therapeutic intervention in order to improve the following deficits and impairments:  Decreased activity tolerance, Decreased endurance, Decreased range of motion, Decreased strength, Impaired flexibility, Improper body mechanics, Pain, Postural dysfunction, Increased muscle spasms  Visit Diagnosis: Cervicalgia     Problem List Patient Active Problem List   Diagnosis Date Noted  . Low back pain 02/23/2015  . Anxiety 01/27/2015  . Clinical depression 01/27/2015  . BP (high blood pressure) 01/27/2015  . Disease of thyroid gland 01/27/2015  . Pain in shoulder 11/11/2014    Marry GuanJoAnne M Kreis, PT, MPT 05/28/2018, 6:07 PM  [facility] [place] [address]  [address] [place], [place], [address] Phone: [phone]   Fax:  [phone]  Name: Phyllis HughsBertha Moore MRN: [id number] Date of Birth: Feb 15, 1961

## 2018-06-04 ENCOUNTER — Ambulatory Visit: Payer: Medicaid Other | Admitting: Physical Therapy

## 2018-06-07 ENCOUNTER — Ambulatory Visit: Payer: Medicaid Other | Attending: Family | Admitting: Physical Therapy

## 2018-06-07 ENCOUNTER — Encounter: Payer: Self-pay | Admitting: Physical Therapy

## 2018-06-07 DIAGNOSIS — R293 Abnormal posture: Secondary | ICD-10-CM | POA: Diagnosis present

## 2018-06-07 DIAGNOSIS — M542 Cervicalgia: Secondary | ICD-10-CM | POA: Diagnosis present

## 2018-06-07 DIAGNOSIS — R252 Cramp and spasm: Secondary | ICD-10-CM | POA: Insufficient documentation

## 2018-06-07 NOTE — Therapy (Signed)
[facility] [address]  [address] [place], [place], [address] Phone: [phone]   Fax:  [phone]  Physical Therapy Treatment  Patient Details  Name: Phyllis Moore MRN: [id number] Date of Birth: 1961/01/01 Referring Provider (PT): Aviva Kluver PA-C   Encounter Date: 06/07/2018  PT End of Session - 06/07/18 1532    Visit Number  3    Number of Visits  5    Date for PT Re-Evaluation  06/13/18    Authorization Type  MCD     Authorization Time Period  06/07/18 - 06/20/18    Authorization - Visit Number  1    Authorization - Number of Visits  3    PT Start Time  1532    PT Stop Time  1614    PT Time Calculation (min)  42 min    Activity Tolerance  Patient tolerated treatment well    Behavior During Therapy  [facility] for tasks assessed/performed       Past Medical History:  Diagnosis Date  . Anxiety   . Hypertension   . Thyroid disease     Past Surgical History:  Procedure Laterality Date  . ABDOMINAL HYSTERECTOMY    . ROTATOR CUFF REPAIR      There were no vitals filed for this visit.  Subjective Assessment - 06/07/18 1536    Subjective  Pt reporting relief from last session lasted until the next day.    Pertinent History  PMH:anx,HTN,hypothroid, RTC surgery April 2019    Diagnostic tests  x-rays    Currently in Pain?  Yes    Pain Score  7     Pain Location  Neck    Pain Orientation  Lower    Pain Descriptors / Indicators  Tingling   "pulling" & "nervy"   Pain Type  Acute pain    Pain Frequency  Intermittent                       [facility] Adult PT Treatment/Exercise - 06/07/18 1532      Exercises   Exercises  Neck      Neck Exercises: Machines for Strengthening   UBE (Upper Arm Bike)  L1.5 x 6 min (3' fwd/3' back)      Neck Exercises: Theraband   Shoulder Extension  15 reps   yellow TB   Shoulder Extension Limitations  standing - 5" hold    Rows  15 reps   yellow TB   Rows Limitations   standing - 5" hold    Shoulder External Rotation  15 reps   yellow TB   Shoulder External Rotation Limitations  supine + scap retraction; 5" hold    Horizontal ABduction  15 reps   yellow TB   Horizontal ABduction Limitations  supine + scap retraction; 5" hold    Other Theraband Exercises  Scap retraction + alt UE diagonals with yellow TB x 10      Neck Exercises: Supine   Neck Retraction  10 reps;5 secs    Neck Retraction Limitations  into pillow      Manual Therapy   Manual Therapy  Soft tissue mobilization;Myofascial release;Manual Traction;Passive ROM    Manual therapy comments  supine    Soft tissue mobilization  STM to B cervical paraspinals, suboccipitals, SCM, UT, LS & pecs (R>L)    Myofascial Release  B suboccipital release, manual TPR R UT & lateral pecs    Passive ROM  gentle cervical PROM all directions    Manual Traction  gentle cervical distraction 3 x 30 sec               PT Short Term Goals - 06/07/18 1539      PT SHORT TERM GOAL #1   Title  Pt will be I and compliant with HEP. 3 weeks     Status  Achieved        PT Long Term Goals - 06/07/18 1540      PT LONG TERM GOAL #1   Title  Pt will improve neck ROM to [facility] (at least 75%). 4 weeks 06/20/18    Status  On-going      PT LONG TERM GOAL #2   Title  Pt will decrease overall pain to less than 4/10 overall with ususal activity. 4 weeks 06/20/18    Baseline  7/10 currently    Status  On-going      PT LONG TERM GOAL #3   Title  Pt will be able to drive with less complaints of neck pain and tightness. 4 weeks 06/20/18    Status  On-going            Plan - 06/07/18 1608    Clinical Impression Statement  September reporting good relief of pain following last session and continuing into the nexy day. She reports better compliance with HEP and review of HEP requiring fewer cues/corrections, although consistent need of cues for pacing and hold times. Continued abnomral muscl tension noted which  responded well to manual therapy with pt denying need for modalities at end of visit.    Rehab Potential  Good    Clinical Impairments Affecting Rehab Potential  chronic pain syndromes, psychosocial factors    PT Treatment/Interventions  Cryotherapy;Electrical Stimulation;Iontophoresis 4mg /ml Dexamethasone;Moist Heat;Traction;Ultrasound;Therapeutic activities;Therapeutic exercise;Neuromuscular re-education;Passive range of motion;Dry needling;Taping;Spinal Manipulations;Joint Manipulations    PT Next Visit Plan  review HEP, pain education, gentle ROM and graded exercise, modalties as needed    Consulted and Agree with Plan of Care  Patient       Patient will benefit from skilled therapeutic intervention in order to improve the following deficits and impairments:  Decreased activity tolerance, Decreased endurance, Decreased range of motion, Decreased strength, Impaired flexibility, Improper body mechanics, Pain, Postural dysfunction, Increased muscle spasms  Visit Diagnosis: Cervicalgia     Problem List Patient Active Problem List   Diagnosis Date Noted  . Low back pain 02/23/2015  . Anxiety 01/27/2015  . Clinical depression 01/27/2015  . BP (high blood pressure) 01/27/2015  . Disease of thyroid gland 01/27/2015  . Pain in shoulder 11/11/2014    Phyllis Moore, PT, MPT 06/07/2018, 5:53 PM  [facility] [address]  [address] [place], [place], [address] Phone: [phone]   Fax:  [phone]  Name: Phyllis Moore MRN: [id number] Date of Birth: [birth year]

## 2018-06-12 ENCOUNTER — Ambulatory Visit: Payer: Medicaid Other | Admitting: Physical Therapy

## 2018-06-12 ENCOUNTER — Encounter: Payer: Self-pay | Admitting: Physical Therapy

## 2018-06-12 DIAGNOSIS — M542 Cervicalgia: Secondary | ICD-10-CM | POA: Diagnosis not present

## 2018-06-12 NOTE — Therapy (Signed)
[facility] [place] [address]  [address] [place], [place], [address] Phone: [phone]   Fax:  [phone]  Physical Therapy Treatment  Patient Details  Name: Phyllis Moore MRN: [id number] Date of Birth: 1961/05/02 Referring Provider (PT): Aviva KluverAlyssa Murray PA-C   Encounter Date: 06/12/2018  PT End of Session - 06/12/18 1536    Visit Number  4    Number of Visits  5    Date for PT Re-Evaluation  06/20/18    Authorization Type  MCD     Authorization Time Period  06/07/18 - 06/20/18    Authorization - Visit Number  2    Authorization - Number of Visits  3    PT Start Time  1536   pt arrived late   PT Stop Time  1633    PT Time Calculation (min)  57 min    Activity Tolerance  Patient tolerated treatment well    Behavior During Therapy  [facility] for tasks assessed/performed       Past Medical History:  Diagnosis Date  . Anxiety   . Hypertension   . Thyroid disease     Past Surgical History:  Procedure Laterality Date  . ABDOMINAL HYSTERECTOMY    . ROTATOR CUFF REPAIR      There were no vitals filed for this visit.  Subjective Assessment - 06/12/18 1538    Subjective  Pt reports she feels like her pain is improving, now ranging between 5/10 and 7/10 most days.    Pertinent History  PMH:anx,HTN,hypothroid, RTC surgery April 2019    Diagnostic tests  x-rays    Currently in Pain?  Yes    Pain Score  6     Pain Location  Neck    Pain Orientation  Lower    Pain Descriptors / Indicators  Tightness;Tingling    Pain Type  Acute pain    Pain Radiating Towards  into B upper shoulders    Pain Frequency  Intermittent                       [facility] Adult PT Treatment/Exercise - 06/12/18 1536      Neck Exercises: Machines for Strengthening   Nustep  L4 x 6 min      Neck Exercises: Theraband   Shoulder Extension  15 reps;Red    Shoulder Extension Limitations  standing - 5" hold    Rows  15 reps;Red    Rows  Limitations  standing - 5" hold    Shoulder External Rotation  10 reps   yellow TB   Shoulder External Rotation Limitations  seated + scap retraction; 5" hold    Horizontal ABduction  10 reps   yellow TB   Horizontal ABduction Limitations  seated + scap retraction; 5" hold               PT Short Term Goals - 06/07/18 1539      PT SHORT TERM GOAL #1   Title  Pt will be I and compliant with HEP. 3 weeks     Status  Achieved        PT Long Term Goals - 06/07/18 1540      PT LONG TERM GOAL #1   Title  Pt will improve neck ROM to [facility] (at least 75%). 4 weeks 06/20/18    Status  On-going      PT LONG TERM GOAL #2   Title  Pt will decrease  overall pain to less than 4/10 overall with ususal activity. 4 weeks 06/20/18    Baseline  7/10 currently    Status  On-going      PT LONG TERM GOAL #3   Title  Pt will be able to drive with less complaints of neck pain and tightness. 4 weeks 06/20/18    Status  On-going            Plan - 06/12/18 1541    Clinical Impression Statement  Attempted to progress HEP exercises today with progression of row resistance to red theraband and transition of scapular retraction from supine to sitting - red TB well tolerated with standing rows and shoulder extension, although continued cues necessary for proper technique, however limited tolerance and increased pain reported with transition of retraction with horizontal abduction and ER from supine to sitting - only upgraded rows to red TB with HEP. Treatment concluded with estim and moist heat for pain management.    Rehab Potential  Good    Clinical Impairments Affecting Rehab Potential  chronic pain syndromes, psychosocial factors    PT Treatment/Interventions  Cryotherapy;Electrical Stimulation;Iontophoresis 4mg /ml Dexamethasone;Moist Heat;Traction;Ultrasound;Therapeutic activities;Therapeutic exercise;Neuromuscular re-education;Passive range of motion;Dry needling;Taping;Spinal Manipulations;Joint  Manipulations    PT Next Visit Plan  Goal assessment & Medicaid reauthorization as indicated; pain education, gentle ROM and graded exercise, modalties as needed    Consulted and Agree with Plan of Care  Patient       Patient will benefit from skilled therapeutic intervention in order to improve the following deficits and impairments:  Decreased activity tolerance, Decreased endurance, Decreased range of motion, Decreased strength, Impaired flexibility, Improper body mechanics, Pain, Postural dysfunction, Increased muscle spasms  Visit Diagnosis: Cervicalgia     Problem List Patient Active Problem List   Diagnosis Date Noted  . Low back pain 02/23/2015  . Anxiety 01/27/2015  . Clinical depression 01/27/2015  . BP (high blood pressure) 01/27/2015  . Disease of thyroid gland 01/27/2015  . Pain in shoulder 11/11/2014    Marry GuanJoAnne M Kreis, PT, MPT 06/12/2018, 6:45 PM  [facility] [place] [address]  [address] [place], [place], [address] Phone: [phone]   Fax:  [phone]  Name: Phyllis Moore MRN: [id number] Date of Birth: 19-Dec-1960

## 2018-06-19 ENCOUNTER — Encounter: Payer: Self-pay | Admitting: Physical Therapy

## 2018-06-19 ENCOUNTER — Ambulatory Visit: Payer: Medicaid Other | Admitting: Physical Therapy

## 2018-06-19 DIAGNOSIS — M542 Cervicalgia: Secondary | ICD-10-CM

## 2018-06-19 DIAGNOSIS — R252 Cramp and spasm: Secondary | ICD-10-CM

## 2018-06-19 DIAGNOSIS — R293 Abnormal posture: Secondary | ICD-10-CM

## 2018-06-19 NOTE — Therapy (Signed)
[facility] [address]  [address] [place], [place], [address] Phone: [phone]   Fax:  [phone]  Physical Therapy Treatment / Recert  Patient Details  Name: Phyllis Moore MRN: [id number] Date of Birth: 1961/04/21 Referring Provider (PT): Aviva Kluver PA-C   Encounter Date: 06/19/2018  PT End of Session - 06/19/18 1542    Visit Number  5    Number of Visits  5    Date for PT Re-Evaluation  06/20/18    Authorization Type  MCD     Authorization Time Period  06/07/18 - 06/20/18    Authorization - Visit Number  3    Authorization - Number of Visits  3    PT Start Time  1542   Pt arrived late   PT Stop Time  1642    PT Time Calculation (min)  60 min    Activity Tolerance  Patient tolerated treatment well    Behavior During Therapy  [facility] for tasks assessed/performed       Past Medical History:  Diagnosis Date  . Anxiety   . Hypertension   . Thyroid disease     Past Surgical History:  Procedure Laterality Date  . ABDOMINAL HYSTERECTOMY    . ROTATOR CUFF REPAIR      There were no vitals filed for this visit.  Subjective Assessment - 06/19/18 1545    Subjective  Pt noting some increased soreness following last PT session - not sure if was muscle soreness from exercises vs "normal pain".    Pertinent History  PMH:anx,HTN,hypothroid, RTC surgery April 2019    Diagnostic tests  x-rays    Currently in Pain?  Yes    Pain Score  7     Pain Location  Neck    Pain Orientation  Lower    Pain Descriptors / Indicators  Tightness;Throbbing    Pain Type  Acute pain    Pain Radiating Towards  Pain & tingling up neck and into B upper shoulders    Pain Onset  More than a month ago    Pain Frequency  Intermittent    Aggravating Factors   laying on side, turning to L    Pain Relieving Factors  heat, Biofreeze    Effect of Pain on Daily Activities  diffiulty tilting head back in shower         [facility] PT Assessment -  06/19/18 1542      Assessment   Medical Diagnosis  Cervical Strain    Referring Provider (PT)  Aviva Kluver PA-C    Hand Dominance  Right      Prior Function   Level of Independence  Independent    Vocation  Unemployed      AROM   Overall AROM   Deficits;Due to pain    Cervical Flexion  36    Cervical Extension  35    Cervical - Right Side Bend  20    Cervical - Left Side Bend  25    Cervical - Right Rotation  34    Cervical - Left Rotation  42      Strength   Right Shoulder Flexion  4-/5    Right Shoulder ABduction  4/5    Right Shoulder Internal Rotation  4+/5    Right Shoulder External Rotation  4/5    Left Shoulder Flexion  4/5    Left Shoulder ABduction  4+/5    Left Shoulder Internal Rotation  4+/5  Left Shoulder External Rotation  4+/5                   [facility] Adult PT Treatment/Exercise - 06/19/18 0001      Self-Care   Self-Care  Posture    Posture  Provided initial education in neutral spine and shoulder posture, addressing proper use of pillows when sleeping to attain neutral spine alignment as well as need for cervical retraction and scapular retraction and depression to reduce UT/LS strain. Further education /training will be beneficial.      Neck Exercises: Machines for Strengthening   UBE (Upper Arm Bike)  L2.0 x 6 min (3' fwd/3' back)      Neck Exercises: Theraband   Shoulder Extension  15 reps   yellow TB   Shoulder Extension Limitations  standing - 5" hold     Rows  15 reps   yellow TB   Rows Limitations  standing - 5" hold - pt reporting anterior R shoulder pain - cues for scap retraction & depression avoiding shoulder hiking and upward rotation - R shoulder pain resolved with this      Modalities   Modalities  Electrical Stimulation;Moist Heat      Moist Heat Therapy   Number Minutes Moist Heat  15 Minutes    Moist Heat Location  Cervical   seated     Electrical Stimulation   Electrical Stimulation Location  B cervical  parapsinals & UT    Electrical Stimulation Action  IFC    Electrical Stimulation Parameters  80-150 Hz, intensity to pt tol x 15"    Electrical Stimulation Goals  Pain      Neck Exercises: Recruitment consultanttretches   Corner Stretch  30 seconds;2 reps    Corner Stretch Limitations  low doorway stretch (unable to tolerate mid or high position due R shoulder)             PT Education - 06/19/18 1616    Education Details  Initial postural education & HEP update - low doorway stretch    Person(s) Educated  Patient    Methods  Explanation;Demonstration;Handout    Comprehension  Verbalized understanding;Returned demonstration;Need further instruction       PT Short Term Goals - 06/07/18 1539      PT SHORT TERM GOAL #1   Title  Pt will be I and compliant with HEP. 3 weeks     Status  Achieved        PT Long Term Goals - 06/19/18 1555      PT LONG TERM GOAL #1   Title  Pt will improve neck [facility] to [facility] (at least 75[facility]     Baseline  ~50% limitation with pain at end range in all motions    Status  On-going    Target Date  07/31/18      PT LONG TERM GOAL #2   Title  Pt will decrease overall pain to less than 4/10 overall with ususal activity.     Baseline  currently 5/10 on average    Status  On-going    Target Date  07/31/18      PT LONG TERM GOAL #3   Title  Pt will be able to drive with less complaints of neck pain and tightness.     Baseline  still feels limited with turning head to check blind spot due to pain and tightness    Status  On-going    Target Date  07/31/18  PT LONG TERM GOAL #4   Title  Patient to demonstrate appropriate posture and body mechanics needed for daily activities    Baseline  pt with foward head and rounded shoulder posture    Status  New    Target Date  07/31/18      PT LONG TERM GOAL #5   Title  Independent with ongoing HEP    Status  New    Target Date  07/31/18            Plan - 06/19/18 1620    Clinical Impression Statement  Doristine CounterBertha  noting improvement in neck pain with PT with average pain now down to 5/10 from 7-10/10 on eval. She continues to demonstrate decreased cervical ROM in all planes with pain at end ROM for all motions whichlimits her ablility to trun her head while driving. She continues require frequent cueing for posture and proper mechanics with exercise performance due to tendency to hike shoulders with most activities/exercises. Given initial benefit from PT with ongoing pain and restriction, recommend continued PT with extended POC for 2x/wk for up to 6 wks.    Rehab Potential  Good    Clinical Impairments Affecting Rehab Potential  chronic pain syndromes, psychosocial factors    PT Frequency  2x / week    PT Duration  6 weeks    PT Treatment/Interventions  Cryotherapy;Electrical Stimulation;Iontophoresis 4mg /ml Dexamethasone;Moist Heat;Traction;Ultrasound;Therapeutic activities;Therapeutic exercise;Neuromuscular re-education;Passive range of motion;Dry needling;Taping;Spinal Manipulations;Joint Manipulations    PT Next Visit Plan  pain education, postural education; gentle ROM and graded exercise, manual therapy and modalties as needed    Consulted and Agree with Plan of Care  Patient       Patient will benefit from skilled therapeutic intervention in order to improve the following deficits and impairments:  Decreased activity tolerance, Decreased endurance, Decreased range of motion, Decreased strength, Impaired flexibility, Improper body mechanics, Pain, Postural dysfunction, Increased muscle spasms  Visit Diagnosis: Cervicalgia  Abnormal posture  Cramp and spasm     Problem List Patient Active Problem List   Diagnosis Date Noted  . Low back pain 02/23/2015  . Anxiety 01/27/2015  . Clinical depression 01/27/2015  . BP (high blood pressure) 01/27/2015  . Disease of thyroid gland 01/27/2015  . Pain in shoulder 11/11/2014    Marry GuanJoAnne M Kreis 06/19/2018, 6:27 PM  [facility] [place] [address]  [address] [place], [place], [id number] Phone: [phone]   Fax:  [phone]  Name: Sandrea HughsBertha Decesare MRN: [id number] Date of Birth: [birth year]

## 2018-06-26 ENCOUNTER — Ambulatory Visit: Payer: Medicaid Other

## 2018-06-28 ENCOUNTER — Ambulatory Visit: Payer: Medicaid Other | Admitting: Physical Therapy

## 2018-06-28 ENCOUNTER — Encounter: Payer: Self-pay | Admitting: Physical Therapy

## 2018-06-28 DIAGNOSIS — R252 Cramp and spasm: Secondary | ICD-10-CM

## 2018-06-28 DIAGNOSIS — R293 Abnormal posture: Secondary | ICD-10-CM

## 2018-06-28 DIAGNOSIS — M542 Cervicalgia: Secondary | ICD-10-CM | POA: Diagnosis not present

## 2018-06-28 NOTE — Therapy (Addendum)
[facility] [place] [facility] [address]  [place], [place], [address] Phone: [phone]   Fax:  [phone]  Physical Therapy Treatment / Discharge Summary  Patient Details  Name: Phyllis Moore MRN: [id number] Date of Birth: 1960/12/08 Referring Provider (PT): Langston Masker PA-C   Encounter Date: 06/28/2018  PT End of Session - 06/28/18 1534    Visit Number  6    Number of Visits  17    Date for PT Re-Evaluation  06/20/18    Authorization Type  Medicaid    Authorization Time Period  06/25/18 - 08/05/18    Authorization - Visit Number  1    Authorization - Number of Visits  12    PT Start Time  7282    PT Stop Time  1622    PT Time Calculation (min)  48 min    Activity Tolerance  Patient tolerated treatment well    Behavior During Therapy  [facility] for tasks assessed/performed       Past Medical History:  Diagnosis Date  . Anxiety   . Hypertension   . Thyroid disease     Past Surgical History:  Procedure Laterality Date  . ABDOMINAL HYSTERECTOMY    . ROTATOR CUFF REPAIR      There were no vitals filed for this visit.  Subjective Assessment - 06/28/18 1537    Subjective  Pt doing well today - no concerns.    Pertinent History  PMH:anx,HTN,hypothroid, RTC surgery April 2019    Diagnostic tests  x-rays    Currently in Pain?  Yes    Pain Score  5     Pain Location  Neck    Pain Orientation  Lower    Pain Descriptors / Indicators  Tightness;Throbbing    Pain Type  Acute pain    Pain Frequency  Intermittent                       [facility] Adult PT Treatment/Exercise - 06/28/18 1534      Self-Care   Self-Care  Posture    Posture  Extensive formal education on proper posture & body mechanics with handout provided and all questions answered.      Exercises   Exercises  Neck      Neck Exercises: Machines for Strengthening   UBE (Upper Arm Bike)  L2.0 x 6 min (3' fwd/3' back)      Neck Exercises:  Stretches   Corner Stretch  30 seconds;2 reps   Advertising copywriter Limitations  3 way doorway stretch - better tolerance for mid & high positions today             PT Education - 06/28/18 1603    Education Details  Posture & body mechanics education for typical daily tasks/chores; Info for home TENS unit    Person(s) Educated  Patient    Methods  Explanation;Demonstration;Handout    Comprehension  Verbalized understanding;Need further instruction       PT Short Term Goals - 06/07/18 1539      PT SHORT TERM GOAL #1   Title  Pt will be I and compliant with HEP. 3 weeks     Status  Achieved        PT Long Term Goals - 06/28/18 1540      PT LONG TERM GOAL #1   Title  Pt will improve neck ROM to [facility] (at least 75%)  Baseline  ~50% limitation with pain at end range in all motions    Status  On-going    Target Date  08/05/18      PT LONG TERM GOAL #2   Title  Pt will decrease overall pain to less than 4/10 overall with ususal activity.     Baseline  currently 5/10 on average    Status  On-going    Target Date  08/05/18      PT LONG TERM GOAL #3   Title  Pt will be able to drive with less complaints of neck pain and tightness.     Baseline  still feels limited with turning head to check blind spot due to pain and tightness    Status  On-going    Target Date  08/05/18      PT LONG TERM GOAL #4   Title  Patient to demonstrate appropriate posture and body mechanics needed for daily activities    Baseline  pt with foward head and rounded shoulder posture    Status  On-going    Target Date  08/05/18      PT LONG TERM GOAL #5   Title  Independent with ongoing HEP    Status  On-going    Target Date  08/05/18            Plan - 06/28/18 1541    Clinical Impression Statement  Rejoice noting benefit from suggested modifications to sleeping position with decreasing pillows as well as doorway stretch added last visit. As such, completed more extensive formal  education on proper posture and body mechanics to reduce neck/back strain with pt noting several areas for improvement.  Pt verbalizing multiple questions which were all addressed. Info also provided on home TENS unit as pt noting benefit from estim on prior visits.    Rehab Potential  Good    Clinical Impairments Affecting Rehab Potential  chronic pain syndromes, psychosocial factors    PT Frequency  2x / week    PT Duration  6 weeks    PT Treatment/Interventions  Cryotherapy;Electrical Stimulation;Iontophoresis '4mg'$ /ml Dexamethasone;Moist Heat;Traction;Ultrasound;Therapeutic activities;Therapeutic exercise;Neuromuscular re-education;Passive range of motion;Dry needling;Taping;Spinal Manipulations;Joint Manipulations    PT Next Visit Plan  review of postural education as indicated; gentle ROM and graded exercise; pain education, manual therapy and modalties as needed    Consulted and Agree with Plan of Care  Patient       Patient will benefit from skilled therapeutic intervention in order to improve the following deficits and impairments:  Decreased activity tolerance, Decreased endurance, Decreased range of motion, Decreased strength, Impaired flexibility, Improper body mechanics, Pain, Postural dysfunction, Increased muscle spasms  Visit Diagnosis: Cervicalgia  Abnormal posture  Cramp and spasm     Problem List Patient Active Problem List   Diagnosis Date Noted  . Low back pain 02/23/2015  . Anxiety 01/27/2015  . Clinical depression 01/27/2015  . BP (high blood pressure) 01/27/2015  . Disease of thyroid gland 01/27/2015  . Pain in shoulder 11/11/2014    Percival Spanish, PT, MPT 06/28/2018, 5:56 PM  [facility] [address]  [place], [place], [address] Phone: [phone]   Fax:  [phone]  Name: Phyllis Moore MRN: [id number] Date of Birth: 09/11/1960  PHYSICAL THERAPY DISCHARGE SUMMARY  Visits from Start  of Care: 6  Current functional level related to goals / functional outcomes:   Refer to above clinical impression for status as of last visit on 06/28/18.  Patient cancelled next 2 appointments and no showed for 3rd appointment. When contacted regarding missed visit, she requested to be discharged. Unable to formally assess status at discharge due to failure to return.    Remaining deficits:   As above.    Education / Equipment:   HEP  Plan: Patient agrees to discharge.  Patient goals were partially met. Patient is being discharged due to not returning since the last visit.  ?????     Percival Spanish, PT, MPT 08/14/18, 2:52 PM  [facility] [address]  [place], [place], [address] Phone: [phone]   Fax:  [phone]

## 2018-06-28 NOTE — Patient Instructions (Addendum)
Sleeping on Back  Place pillow under knees. A pillow with cervical support and a roll around waist are also helpful. Copyright  VHI. All rights reserved.  Sleeping on Side Place pillow between knees. Use cervical support under neck and a roll around waist as needed. Copyright  VHI. All rights reserved.   Sleeping on Stomach   If this is the only desirable sleeping position, place pillow under lower legs, and under stomach or chest as needed.  Posture - Sitting   Sit upright, head facing forward. Try using a roll to support lower back. Keep shoulders relaxed, and avoid rounded back. Keep hips level with knees. Avoid crossing legs for long periods. Stand to Sit / Sit to Stand   To sit: Bend knees to lower self onto front edge of chair, then scoot back on seat. To stand: Reverse sequence by placing one foot forward, and scoot to front of seat. Use rocking motion to stand up.   Work Height and Reach  Ideal work height is no more than 2 to 4 inches below elbow level when standing, and at elbow level when sitting. Reaching should be limited to arm's length, with elbows slightly bent.  Bending  Bend at hips and knees, not back. Keep feet shoulder-width apart.    Posture - Standing   Good posture is important. Avoid slouching and forward head thrust. Maintain curve in low back and align ears over shoul- ders, hips over ankles.  Alternating Positions   Alternate tasks and change positions frequently to reduce fatigue and muscle tension. Take rest breaks. Computer Work   Position work to face forward. Use proper work and seat height. Keep shoulders back and down, wrists straight, and elbows at right angles. Use chair that provides full back support. Add footrest and lumbar roll as needed.  Getting Into / Out of Car  Lower self onto seat, scoot back, then bring in one leg at a time. Reverse sequence to get out.  Dressing  Lie on back to pull socks or slacks over feet, or sit  and bend leg while keeping back straight.    Housework - Sink  Place one foot on ledge of cabinet under sink when standing at sink for prolonged periods.   Pushing / Pulling  Pushing is preferable to pulling. Keep back in proper alignment, and use leg muscles to do the work.  Deep Squat   Squat and lift with both arms held against upper trunk. Tighten stomach muscles without holding breath. Use smooth movements to avoid jerking.  Avoid Twisting   Avoid twisting or bending back. Pivot around using foot movements, and bend at knees if needed when reaching for articles.  Carrying Luggage   Distribute weight evenly on both sides. Use a cart whenever possible. Do not twist trunk. Move body as a unit.   Lifting Principles .Maintain proper posture and head alignment. .Slide object as close as possible before lifting. .Move obstacles out of the way. .Test before lifting; ask for help if too heavy. .Tighten stomach muscles without holding breath. .Use smooth movements; do not jerk. .Use legs to do the work, and pivot with feet. .Distribute the work load symmetrically and close to the center of trunk. .Push instead of pull whenever possible.   Ask For Help   Ask for help and delegate to others when possible. Coordinate your movements when lifting together, and maintain the low back curve.  Log Roll   Lying on back, bend left knee and place left   arm across chest. Roll all in one movement to the right. Reverse to roll to the left. Always move as one unit. Housework - Sweeping  Use long-handled equipment to avoid stooping.   Housework - Wiping  Position yourself as close as possible to reach work surface. Avoid straining your back.  Laundry - Unloading Wash   To unload small items at bottom of washer, lift leg opposite to arm being used to reach.  Gardening - Raking  Move close to area to be raked. Use arm movements to do the work. Keep back straight and avoid  twisting.     Cart  When reaching into cart with one arm, lift opposite leg to keep back straight.   Getting Into / Out of Bed  Lower self to lie down on one side by raising legs and lowering head at the same time. Use arms to assist moving without twisting. Bend both knees to roll onto back if desired. To sit up, start from lying on side, and use same move-ments in reverse. Housework - Vacuuming  Hold the vacuum with arm held at side. Step back and forth to move it, keeping head up. Avoid twisting.   Laundry - Armed forces training and education officer so that bending and twisting can be avoided.   Laundry - Unloading Dryer  Squat down to reach into clothes dryer or use a reacher.  Gardening - Weeding / Psychiatric nurse or Kneel. Knee pads may be helpful.                   TENS UNIT  This is helpful for muscle pain and spasm.   Search and Purchase a TENS 7000 2nd edition at www.tenspros.com or www.amazon.com  (It should be less than $30)     TENS unit instructions:   Do not shower or bathe with the unit on  Turn the unit off before removing electrodes or batteries  If the electrodes lose stickiness add a drop of water to the electrodes after they are disconnected from the unit and place on plastic sheet. If you continued to have difficulty, call the TENS unit company to purchase more electrodes.  Do not apply lotion on the skin area prior to use. Make sure the skin is clean and dry as this will help prolong the life of the electrodes.  After use, always check skin for unusual red areas, rash or other skin difficulties. If there are any skin problems, does not apply electrodes to the same area.  Never remove the electrodes from the unit by pulling the wires.  Do not use the TENS unit or electrodes other than as directed.  Do not change electrode placement without consulting your therapist or physician.  Keep 2 fingers with between each  electrode.   TENS stands for Transcutaneous Electrical Nerve Stimulation. In other words, electrical impulses are allowed to pass through the skin in order to excite a nerve.   Purpose and Use of TENS:  TENS is a method used to manage acute and chronic pain without the use of drugs. It has been effective in managing pain associated with surgery, sprains, strains, trauma, rheumatoid arthritis, and neuralgias. It is a non-addictive, low risk, and non-invasive technique used to control pain. It is not, by any means, a curative form of treatment.   How TENS Works:  Most TENS units are a Statistician unit powered by one 9 volt battery. Attached to the outside of the unit are two lead  wires where two pins and/or snaps connect on each wire. All units come with a set of four reusable pads or electrodes. These are placed on the skin surrounding the area involved. By inserting the leads into  the pads, the electricity can pass from the unit making the circuit complete.  As the intensity is turned up slowly, the electrical current enters the body from the electrodes through the skin to the surrounding nerve fibers. This triggers the release of hormones from within the body. These hormones contain pain relievers. By increasing the circulation of these hormones, the person's pain may be lessened. It is also believed that the electrical stimulation itself helps to block the pain messages being sent to the brain, thus also decreasing the body's perception of pain.   Hazards:  TENS units are NOT to be used by patients with PACEMAKERS, DEFIBRILLATORS, DIABETIC PUMPS, PREGNANT WOMEN, and patients with SEIZURE DISORDERS.  TENS units are NOT to be used over the heart, throat, brain, or spinal cord.  One of the major side effects from the TENS unit may be skin irritation. Some people may develop a rash if they are sensitive to the materials used in the electrodes or the connecting wires.   Wear the unit for up to  30-40 minutes at a time, up to 3-4 times per day.   Avoid overuse due the body getting used to the stem making it not as effective over time.

## 2018-07-03 ENCOUNTER — Ambulatory Visit: Payer: Medicaid Other | Admitting: Physical Therapy

## 2018-07-05 ENCOUNTER — Ambulatory Visit: Payer: Medicaid Other

## 2018-07-10 ENCOUNTER — Ambulatory Visit: Payer: Medicaid Other | Attending: Family

## 2018-07-12 ENCOUNTER — Encounter: Payer: Medicaid Other | Admitting: Physical Therapy

## 2018-07-19 ENCOUNTER — Encounter: Payer: Medicaid Other | Admitting: Physical Therapy

## 2018-07-26 ENCOUNTER — Encounter: Payer: Medicaid Other | Admitting: Physical Therapy

## 2020-03-16 ENCOUNTER — Encounter: Payer: Self-pay | Admitting: *Deleted

## 2020-03-16 ENCOUNTER — Ambulatory Visit: Payer: Medicaid Other | Admitting: Neurology

## 2020-03-16 ENCOUNTER — Telehealth: Payer: Self-pay | Admitting: *Deleted

## 2020-03-16 ENCOUNTER — Encounter: Payer: Self-pay | Admitting: Neurology

## 2020-03-16 NOTE — Telephone Encounter (Signed)
Pt no showed new patient appt this morning. This is her 1st no show.

## 2020-03-16 NOTE — Progress Notes (Signed)
Per notes from [facility] on [address]

## 2020-05-21 ENCOUNTER — Ambulatory Visit: Payer: Medicaid Other | Admitting: Neurology

## 2020-08-03 ENCOUNTER — Telehealth: Payer: Self-pay | Admitting: Neurology

## 2020-08-03 ENCOUNTER — Ambulatory Visit: Payer: Medicaid Other | Admitting: Neurology

## 2020-08-03 ENCOUNTER — Encounter: Payer: Self-pay | Admitting: Neurology

## 2020-08-03 VITALS — BP 141/76 | HR 84 | Ht 62.0 in | Wt 217.0 lb

## 2020-08-03 DIAGNOSIS — G43711 Chronic migraine without aura, intractable, with status migrainosus: Secondary | ICD-10-CM | POA: Diagnosis not present

## 2020-08-03 NOTE — Progress Notes (Addendum)
[facility]    Provider:  Dr Lucia Gaskins Requesting Provider: Gabriel Cirri* Primary Care Provider:  Gabriel Cirri*  CC:  Migraines  HPI:  Phyllis Moore is a 60 y.o. female here as requested by Gabriel Cirri* for migraines. PMHx anxiety, hypertension, hyperlipidemia, diabetes, smoking cessation, chronic pain, lumbosacral pain. She has sleep apnea and she is using her cpap every night. She has had migraines for 3 years worsening the last 6 months, pulsating,throbbing, pounding, light sensitivity, have to be in a dark room which helps, pounding so bad it is unbearable, nausea, movement makes it worse, unilateral but spreads bilaterally, behind the eyes or in the back of the head everywhere, daily, lasting up to 24 hours, lots of stress she lost her daughter (she passed away) and she was raising her 59 year old granddaughter but she is moving to the other granddaughter (the father is dead). Lots of stress.Headaches are positional, can be in the morning, blurry vision, can;t bend over.  Worsening the last 6 months. She can't sleep. She hasn't slept because of all the stress it has been very bad since her daughter died, 1 year last 22-Apr-2023, she had to go to court. She is seeing someone for grief, she goes to mental health, she just restarted the wellbutrin, for the last 6 months almost daily migraines, vision changes. She is crying today throughout the appointment. No aura. No medication overuse, daily severe migraines. She has neck pain. No other focal neurologic deficits, associated symptoms, inciting events or modifiable factors.  Reviewed notes, labs and imaging from outside physicians, which showed:  From a thorough review of records, medications tried that can be used in migraine management include: Wellbutrin, Flexeril, Voltaren tablets, over-the-counter NSAIDs, aspirin, Tylenol, Prozac, gabapentin, hydrocodone, Toradol injections, lisinopril, meloxicam  tablets, Robaxin tablets, amitriptyline, Reglan tablets, naproxen tablets, Zofran, Percocet, prednisone tablets, Zoloft, Topamax, metoprolol   Optic nerve OCT, OU, normal 09/30/2019 (she goes back next month)  09/09/2007: CT head (reviewed report)  IMPRESSION:   1. No acute intracranial abnormality. Please note that MRI is more  sensitive for detecting acute stroke the first 24 to 48 hours.  2. Nonspecific soft tissue nodule in the left upper scalp. Correlation  with physical exam is recommended.    DG cervical Spine 04/30/2018:  : Cervical spine alignment is maintained. Disc space narrowing with endplate spurring at C5-C6, not significantly changed from prior exam. Vertebral body heights are preserved. The dens is intact. Posterior elements appear well-aligned. AP view slightly obscured by overlying artifact. There is no evidence of fracture. No prevertebral soft tissue edema. Rightward tracheal deviation is unchanged from prior exam.  IMPRESSION: Degenerative disc disease at C5-C6, not significantly changed from 2017 radiographs. No evidence of acute osseous abnormality. (personally reviewed images)   Review of Systems: Patient complains of symptoms per HPI as well as the following symptoms: migraines and headaches, stress, grief. Pertinent negatives and positives per HPI. All others negative.   Social History   Socioeconomic History  . Marital status: Single    Spouse name: Not on file  . Number of children: Not on file  . Years of education: Not on file  . Highest education level: Not on file  Occupational History  . Not on file  Tobacco Use  . Smoking status: Current Every Day Smoker    Packs/day: 0.50    Types: Cigarettes  . Smokeless tobacco: Never Used  Vaping Use  . Vaping Use: Never used  Substance and Sexual Activity  .  Alcohol use: No    Alcohol/week: 0.0 standard drinks  . Drug use: No  . Sexual activity: Never    Birth control/protection: Surgical   Other Topics Concern  . Not on file  Social History Narrative   Lives alone   Right handed   Caffeine: 4 cups/day   Social Determinants of Health   Financial Resource Strain: Not on file  Food Insecurity: Not on file  Transportation Needs: Not on file  Physical Activity: Not on file  Stress: Not on file  Social Connections: Not on file  Intimate Partner Violence: Not on file    Family History  Problem Relation Age of Onset  . Stroke Mother   . Cancer Mother   . Migraines Sister   . Migraines Niece     Past Medical History:  Diagnosis Date  . Anxiety   . Diabetes mellitus, type II (HCC)    per [facility]  . Hyperlipidemia    per [facility] notes  . Hypertension   . Thyroid disease     Patient Active Problem List   Diagnosis Date Noted  . Chronic migraine without aura, with intractable migraine, so stated, with status migrainosus 08/03/2020  . Low back pain 02/23/2015  . Anxiety 01/27/2015  . Clinical depression 01/27/2015  . BP (high blood pressure) 01/27/2015  . Disease of thyroid gland 01/27/2015  . Pain in shoulder 11/11/2014    Past Surgical History:  Procedure Laterality Date  . ABDOMINAL HYSTERECTOMY    . CARPAL TUNNEL RELEASE Bilateral   . ROTATOR CUFF REPAIR Right     Current Outpatient Medications  Medication Sig Dispense Refill  . albuterol (VENTOLIN HFA) 108 (90 Base) MCG/ACT inhaler Inhale 1-2 puffs into the lungs every 6 (six) hours as needed for wheezing or shortness of breath.    . ALPRAZolam (XANAX) 1 MG tablet Take 1 mg by mouth 3 (three) times daily as needed.     Marland Kitchen buPROPion (WELLBUTRIN SR) 150 MG 12 hr tablet Take 150 mg by mouth 2 (two) times daily.    . cetirizine (ZYRTEC) 10 MG tablet Take 10 mg by mouth daily.    . cyclobenzaprine (FLEXERIL) 10 MG tablet Take 10 mg by mouth 3 (three) times daily as needed for muscle spasms.    . diclofenac Sodium (VOLTAREN) 1 % GEL Apply topically 4 (four) times daily.     . fluticasone (FLONASE) 50 MCG/ACT nasal spray Place 1-2 sprays into both nostrils daily.    . furosemide (LASIX) 20 MG tablet Take 20 mg by mouth daily.    Marland Kitchen gabapentin (NEURONTIN) 300 MG capsule Take 300 mg by mouth 3 (three) times daily.    . hydrochlorothiazide (HYDRODIURIL) 12.5 MG tablet Take 12.5 mg by mouth daily.    Marland Kitchen HYDROcodone-acetaminophen (NORCO/VICODIN) 5-325 MG tablet Take 1 tablet by mouth in the morning, at noon, and at bedtime.    Marland Kitchen ibuprofen (ADVIL) 800 MG tablet Take 800 mg by mouth 3 (three) times daily.    Marland Kitchen levothyroxine (SYNTHROID) 25 MCG tablet Take 25 mcg by mouth daily before breakfast.    . lisinopril (PRINIVIL,ZESTRIL) 10 MG tablet Take 1 tablet (10 mg total) by mouth daily.    . metFORMIN (GLUCOPHAGE-XR) 500 MG 24 hr tablet Take 500 mg by mouth in the morning and at bedtime.    Marland Kitchen nystatin (MYCOSTATIN) 100000 UNIT/ML suspension Take 6 mLs by mouth 4 (four) times daily.    . Pitavastatin Calcium (LIVALO) 4 MG TABS Take  4 mg by mouth daily.    Marland Kitchen FLUoxetine (PROZAC) 20 MG capsule Take 20 mg by mouth.     No current facility-administered medications for this visit.    Allergies as of 08/03/2020  . (No Known Allergies)    Vitals: BP (!) 141/76 (BP Location: Right Arm, Patient Position: Sitting)   Pulse 84   Ht 5\' 2"  (1.575 m)   Wt 217 lb (98.4 kg)   BMI 39.69 kg/m  Last Weight:  Wt Readings from Last 1 Encounters:  08/03/20 217 lb (98.4 kg)   Last Height:   Ht Readings from Last 1 Encounters:  08/03/20 5\' 2"  (1.575 m)     Physical exam: Exam: Gen: NAD, conversant, well nourised, obese, well groomed                     CV: RRR, no MRG. No Carotid Bruits. No peripheral edema, warm, nontender Eyes: Conjunctivae clear without exudates or hemorrhage  Neuro: Detailed Neurologic Exam  Speech:    Speech is normal; fluent and spontaneous with normal comprehension.  Cognition:    The patient is oriented to person, place, and time;     recent and  remote memory intact;     language fluent;     normal attention, concentration,     fund of knowledge Cranial Nerves:    The pupils are equal, round, and reactive to light. The fundi are flat. Visual fields are full to finger confrontation. Extraocular movements are intact. Trigeminal sensation is intact and the muscles of mastication are normal. The face is symmetric. The palate elevates in the midline. Hearing intact. Voice is normal. Shoulder shrug is normal. The tongue has normal motion without fasciculations.   Coordination:    No dysmetria or ataxia   Gait:    Wide based may be due to large body habitus, not ataxic or parkinsonian  Motor Observation:    No asymmetry, no atrophy, and no involuntary movements noted. Tone:    Normal muscle tone.    Posture:    Posture is normal. normal erect    Strength:    Strength is V/V in the upper and lower limbs.      Sensation: intact to LT     Reflex Exam:  DTR's:    Deep tendon reflexes in the upper and lower extremities are symmetrical bilaterally.   Toes:    The toes are equivocal bilaterally.   Clonus:    Clonus is absent.    Assessment/Plan:  60 year old with chronic migraines. Worsening and daily and intractable lately, needs MRI brain.  Failed multiple medications, we discussed other options today and we decided to initiate Botox for migraines.  Start Botox for migraines Bloodwork today MRI brain with and without contrast due to intractable nature of headaches, worsening headaches, vision changes, positional nature, to evaluate for intracranial etiologies such as space-occupying masses or lesions.   Orders Placed This Encounter  Procedures  . Comprehensive metabolic panel  . CBC with Differential/Platelets  . TSH   No orders of the defined types were placed in this encounter.  Discussed: To prevent or relieve headaches, try the following: Cool Compress. Lie down and place a cool compress on your head.  Avoid  headache triggers. If certain foods or odors seem to have triggered your migraines in the past, avoid them. A headache diary might help you identify triggers.  Include physical activity in your daily routine. Try a daily walk or other moderate  aerobic exercise.  Manage stress. Find healthy ways to cope with the stressors, such as delegating tasks on your to-do list.  Practice relaxation techniques. Try deep breathing, yoga, massage and visualization.  Eat regularly. Eating regularly scheduled meals and maintaining a healthy diet might help prevent headaches. Also, drink plenty of fluids.  Follow a regular sleep schedule. Sleep deprivation might contribute to headaches Consider biofeedback. With this mind-body technique, you learn to control certain bodily functions -- such as muscle tension, heart rate and blood pressure -- to prevent headaches or reduce headache pain.    Proceed to emergency room if you experience new or worsening symptoms or symptoms do not resolve, if you have new neurologic symptoms or if headache is severe, or for any concerning symptom.   Provided education and documentation from American headache Society toolbox including articles on: chronic migraine medication overuse headache, chronic migraines, prevention of migraines, behavioral and other nonpharmacologic treatments for headache.   Cc: Arlan OrganSanchez-Brugal, Fernand*,    Marishka Rentfrow, MD  [facility] [address] [place], [place] [id number]  Phone [phone] Fax [phone]

## 2020-08-03 NOTE — Patient Instructions (Signed)
Start Botox Blood work today MRI of the brain  OnabotulinumtoxinA injection (Medical Use) What is this medicine? ONABOTULINUMTOXINA (o na BOTT you lye num tox in eh) is a neuro-muscular blocker. This medicine is used to treat crossed eyes, eyelid spasms, severe neck muscle spasms, ankle and toe muscle spasms, and elbow, wrist, and finger muscle spasms. It is also used to treat excessive underarm sweating, to prevent chronic migraine headaches, and to treat loss of bladder control due to neurologic conditions such as multiple sclerosis or spinal cord injury. This medicine may be used for other purposes; ask your health care provider or pharmacist if you have questions. COMMON BRAND NAME(S): Botox What should I tell my health care provider before I take this medicine? They need to know if you have any of these conditions:  breathing problems  cerebral palsy spasms  difficulty urinating  heart problems  history of surgery where this medicine is going to be used  infection at the site where this medicine is going to be used  myasthenia gravis or other neurologic disease  nerve or muscle disease  surgery plans  take medicines that treat or prevent blood clots  thyroid problems  an unusual or allergic reaction to botulinum toxin, albumin, other medicines, foods, dyes, or preservatives  pregnant or trying to get pregnant  breast-feeding How should I use this medicine? This medicine is for injection into a muscle. It is given by a health care professional in a hospital or clinic setting. Talk to your pediatrician regarding the use of this medicine in children. While this drug may be prescribed for children as young as 25 years old for selected conditions, precautions do apply. Overdosage: If you think you have taken too much of this medicine contact a poison control center or emergency room at once. NOTE: This medicine is only for you. Do not share this medicine with others. What  if I miss a dose? This does not apply. What may interact with this medicine?  aminoglycoside antibiotics like gentamicin, neomycin, tobramycin  muscle relaxants  other botulinum toxin injections This list may not describe all possible interactions. Give your health care provider a list of all the medicines, herbs, non-prescription drugs, or dietary supplements you use. Also tell them if you smoke, drink alcohol, or use illegal drugs. Some items may interact with your medicine. What should I watch for while using this medicine? Visit your doctor for regular check ups. This medicine will cause weakness in the muscle where it is injected. Tell your doctor if you feel unusually weak in other muscles. Get medical help right away if you have problems with breathing, swallowing, or talking. This medicine might make your eyelids droop or make you see blurry or double. If you have weak muscles or trouble seeing do not drive a car, use machinery, or do other dangerous activities. This medicine contains albumin from human blood. It may be possible to pass an infection in this medicine, but no cases have been reported. Talk to your doctor about the risks and benefits of this medicine. If your activities have been limited by your condition, go back to your regular routine slowly after treatment with this medicine. What side effects may I notice from receiving this medicine? Side effects that you should report to your doctor or health care professional as soon as possible:  allergic reactions like skin rash, itching or hives, swelling of the face, lips, or tongue  breathing problems  changes in vision  chest pain or  tightness  eye irritation, pain  fast, irregular heartbeat  infection  numbness  speech problems  swallowing problems  unusual weakness Side effects that usually do not require medical attention (report to your doctor or health care professional if they continue or are  bothersome):  bruising or pain at site where injected  drooping eyelid  dry eyes or mouth  headache  muscles aches, pains  sensitivity to light  tearing This list may not describe all possible side effects. Call your doctor for medical advice about side effects. You may report side effects to FDA at 1-800-FDA-1088. Where should I keep my medicine? This drug is given in a hospital or clinic and will not be stored at home. NOTE: This sheet is a summary. It may not cover all possible information. If you have questions about this medicine, talk to your doctor, pharmacist, or health care provider.  2021 Elsevier/Gold Standard (2017-11-27 14:21:42)

## 2020-08-03 NOTE — Telephone Encounter (Signed)
Let's start the botox approval please for chronic migraines g43.711. She can stay with me for botox thanks

## 2020-08-04 LAB — CBC WITH DIFFERENTIAL/PLATELET
Basophils Absolute: 0 10*3/uL (ref 0.0–0.2)
Basos: 1 %
EOS (ABSOLUTE): 0 10*3/uL (ref 0.0–0.4)
Eos: 1 %
Hematocrit: 42.8 % (ref 34.0–46.6)
Hemoglobin: 14.5 g/dL (ref 11.1–15.9)
Immature Grans (Abs): 0 10*3/uL (ref 0.0–0.1)
Immature Granulocytes: 1 %
Lymphocytes Absolute: 2.7 10*3/uL (ref 0.7–3.1)
Lymphs: 43 %
MCH: 31.2 pg (ref 26.6–33.0)
MCHC: 33.9 g/dL (ref 31.5–35.7)
MCV: 92 fL (ref 79–97)
Monocytes Absolute: 0.4 10*3/uL (ref 0.1–0.9)
Monocytes: 7 %
Neutrophils Absolute: 3 10*3/uL (ref 1.4–7.0)
Neutrophils: 47 %
Platelets: 205 10*3/uL (ref 150–450)
RBC: 4.65 x10E6/uL (ref 3.77–5.28)
RDW: 13 % (ref 11.7–15.4)
WBC: 6.2 10*3/uL (ref 3.4–10.8)

## 2020-08-04 LAB — COMPREHENSIVE METABOLIC PANEL
ALT: 27 IU/L (ref 0–32)
AST: 17 IU/L (ref 0–40)
Albumin/Globulin Ratio: 1.9 (ref 1.2–2.2)
Albumin: 4.3 g/dL (ref 3.8–4.9)
Alkaline Phosphatase: 89 IU/L (ref 44–121)
BUN/Creatinine Ratio: 12 (ref 9–23)
BUN: 10 mg/dL (ref 6–24)
Bilirubin Total: <0.2 mg/dL (ref 0.0–1.2)
CO2: 21 mmol/L (ref 20–29)
Calcium: 9.9 mg/dL (ref 8.7–10.2)
Chloride: 100 mmol/L (ref 96–106)
Creatinine, Ser: 0.82 mg/dL (ref 0.57–1.00)
Globulin, Total: 2.3 g/dL (ref 1.5–4.5)
Glucose: 311 mg/dL — ABNORMAL HIGH (ref 65–99)
Potassium: 4 mmol/L (ref 3.5–5.2)
Sodium: 138 mmol/L (ref 134–144)
Total Protein: 6.6 g/dL (ref 6.0–8.5)
eGFR: 83 mL/min/{1.73_m2} (ref >59)

## 2020-08-04 LAB — TSH: TSH: 0.687 u[IU]/mL (ref 0.450–4.500)

## 2020-08-04 NOTE — Telephone Encounter (Addendum)
Botox charge sheet completed, signed by Dr Lucia Gaskins, and given to botox coordinator. Dx: Y65.993. Need pt consent at first injection appt.

## 2020-08-05 ENCOUNTER — Telehealth: Payer: Self-pay | Admitting: *Deleted

## 2020-08-05 NOTE — Telephone Encounter (Signed)
-----   Message from Anson Fret, MD sent at 08/04/2020 12:36 PM EST ----- Glucose very elevated at 311 otherwise labs normal thanks

## 2020-08-05 NOTE — Telephone Encounter (Signed)
Filled out Medicaid [place] form for Botox. Gave to MD to sign. Upon approval for Botox, patient will need to use Dispensing optician. I will fill out prescription enrollment form also and give to MD to sign.

## 2020-08-05 NOTE — Telephone Encounter (Signed)
I called the patient and asked for a call back. When she calls back, please let her know her labs came back and her blood sugar was very elevated at 311. She should follow up with her primary care about the blood sugar. Otherwise her labs were normal.

## 2020-08-05 NOTE — Telephone Encounter (Signed)
Faxed signed PA request to Medicaid with office notes.

## 2020-08-06 NOTE — Telephone Encounter (Signed)
Pt called and the message from [facility] was relayed

## 2020-08-06 NOTE — Telephone Encounter (Signed)
Received approval from Medicaid. PA #[id number] (08/05/20- 02/01/21).

## 2020-08-10 NOTE — Telephone Encounter (Signed)
Received signed prescription enrollment form for [facility]. Faxed to [employer]. I called the patient to schedule her first Botox appointment but she did not answer. I LVM requesting she call back to discuss.

## 2020-08-10 NOTE — Telephone Encounter (Signed)
Patient returned my call. We scheduled her first appointment for 3/22. I discussed the use of [facility] with patient. She verbalized understanding. I advised that I will follow-up with them at the beginning of next week and give pt an update on the order status.

## 2020-08-13 NOTE — Telephone Encounter (Signed)
Received call from Brittney at [facility] to schedule Botox delivery. Botox TBD 3/15.

## 2020-08-18 NOTE — Telephone Encounter (Signed)
Received (1) 200 unit vial of Botox today from [facility]. 

## 2020-08-25 ENCOUNTER — Ambulatory Visit: Payer: Medicaid Other | Admitting: Neurology

## 2020-08-25 ENCOUNTER — Other Ambulatory Visit: Payer: Self-pay

## 2020-08-25 DIAGNOSIS — G43711 Chronic migraine without aura, intractable, with status migrainosus: Secondary | ICD-10-CM | POA: Diagnosis not present

## 2020-08-25 NOTE — Progress Notes (Signed)
Consent Form Botulism Toxin Injection For Chronic Migraine  08/25/2020: This is our first botox. +5U orbicularis oculi bilaterally.  Reviewed orally with patient, additionally signature is on file:  Botulism toxin has been approved by the Federal drug administration for treatment of chronic migraine. Botulism toxin does not cure chronic migraine and it may not be effective in some patients.  The administration of botulism toxin is accomplished by injecting a small amount of toxin into the muscles of the neck and head. Dosage must be titrated for each individual. Any benefits resulting from botulism toxin tend to wear off after 3 months with a repeat injection required if benefit is to be maintained. Injections are usually done every 3-4 months with maximum effect peak achieved by about 2 or 3 weeks. Botulism toxin is expensive and you should be sure of what costs you will incur resulting from the injection.  The side effects of botulism toxin use for chronic migraine may include:   -Transient, and usually mild, facial weakness with facial injections  -Transient, and usually mild, head or neck weakness with head/neck injections  -Reduction or loss of forehead facial animation due to forehead muscle weakness  -Eyelid drooping  -Dry eye  -Pain at the site of injection or bruising at the site of injection  -Double vision  -Potential unknown long term risks  Contraindications: You should not have Botox if you are pregnant, nursing, allergic to albumin, have an infection, skin condition, or muscle weakness at the site of the injection, or have myasthenia gravis, Lambert-Eaton syndrome, or ALS.  It is also possible that as with any injection, there may be an allergic reaction or no effect from the medication. Reduced effectiveness after repeated injections is sometimes seen and rarely infection at the injection site may occur. All care will be taken to prevent these side effects. If therapy is given  over a long time, atrophy and wasting in the muscle injected may occur. Occasionally the patient's become refractory to treatment because they develop antibodies to the toxin. In this event, therapy needs to be modified.  I have read the above information and consent to the administration of botulism toxin.    BOTOX PROCEDURE NOTE FOR MIGRAINE HEADACHE    Contraindications and precautions discussed with patient(above). Aseptic procedure was observed and patient tolerated procedure. Procedure performed by Dr. Artemio Aly  The condition has existed for more than 6 months, and pt does not have a diagnosis of ALS, Myasthenia Gravis or Lambert-Eaton Syndrome.  Risks and benefits of injections discussed and pt agrees to proceed with the procedure.  Written consent obtained  These injections are medically necessary. Pt  receives good benefits from these injections. These injections do not cause sedations or hallucinations which the oral therapies may cause.  Description of procedure:  The patient was placed in a sitting position. The standard protocol was used for Botox as follows, with 5 units of Botox injected at each site:   -Procerus muscle, midline injection  -Corrugator muscle, bilateral injection  -Frontalis muscle, bilateral injection, with 2 sites each side, medial injection was performed in the upper one third of the frontalis muscle, in the region vertical from the medial inferior edge of the superior orbital rim. The lateral injection was again in the upper one third of the forehead vertically above the lateral limbus of the cornea, 1.5 cm lateral to the medial injection site.  -Temporalis muscle injection, 4 sites, bilaterally. The first injection was 3 cm above the tragus of the  ear, second injection site was 1.5 cm to 3 cm up from the first injection site in line with the tragus of the ear. The third injection site was 1.5-3 cm forward between the first 2 injection sites. The fourth  injection site was 1.5 cm posterior to the second injection site.   -Occipitalis muscle injection, 3 sites, bilaterally. The first injection was done one half way between the occipital protuberance and the tip of the mastoid process behind the ear. The second injection site was done lateral and superior to the first, 1 fingerbreadth from the first injection. The third injection site was 1 fingerbreadth superiorly and medially from the first injection site.  -Cervical paraspinal muscle injection, 2 sites, bilateral knee first injection site was 1 cm from the midline of the cervical spine, 3 cm inferior to the lower border of the occipital protuberance. The second injection site was 1.5 cm superiorly and laterally to the first injection site.  -Trapezius muscle injection was performed at 3 sites, bilaterally. The first injection site was in the upper trapezius muscle halfway between the inflection point of the neck, and the acromion. The second injection site was one half way between the acromion and the first injection site. The third injection was done between the first injection site and the inflection point of the neck.   Will return for repeat injection in 3 months.   200 units of Botox was used, any Botox not injected was wasted. The patient tolerated the procedure well, there were no complications of the above procedure.

## 2020-08-25 NOTE — Progress Notes (Signed)
Botox consent signed by patient  Botox- 200 units x 1 vial Lot: [id number] Expiration: 03/2023 NDC: 3013-1438-88  Bacteriostatic 0.9% Sodium Chloride- 54mL total Lot: [id number] Expiration: 07/07/2021 NDC: 8206-0156-15  Dx: P79.432 SP

## 2020-08-31 IMAGING — DX DG CERVICAL SPINE 2 OR 3 VIEWS
5 series · 5 of 5 positions shown · non-contrast
Comparison: Radiographs 08/12/2015

CLINICAL DATA: Cervical neck pain for 4 weeks.  No known injury.

EXAM:
CERVICAL SPINE - 2-3 VIEW

[c-spine lat]
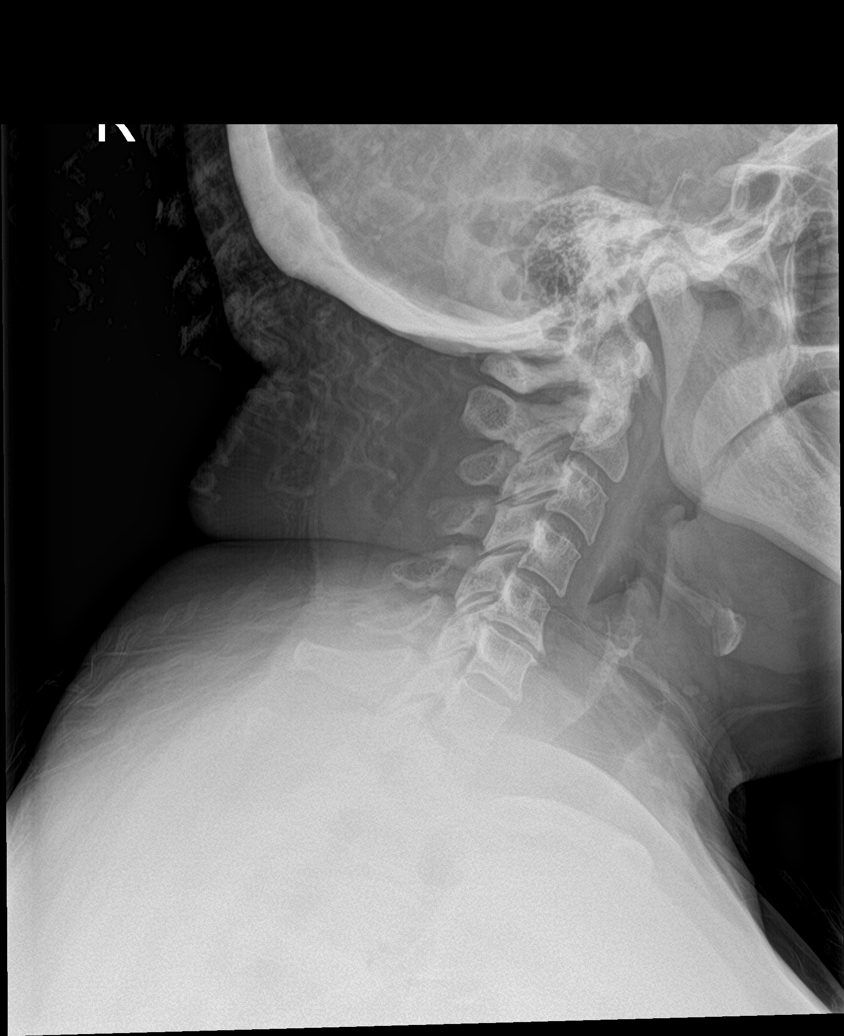

[c-spine ap]
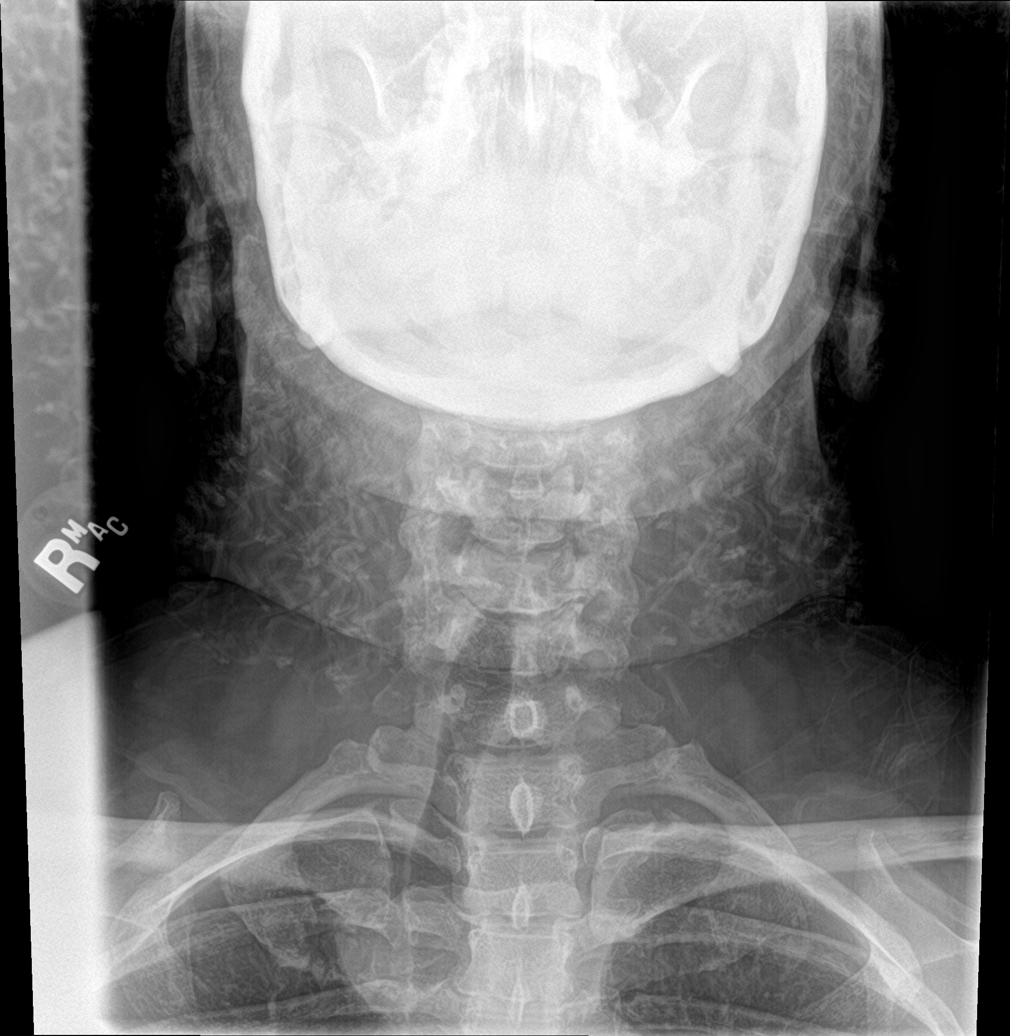

[c-spine open mouth]
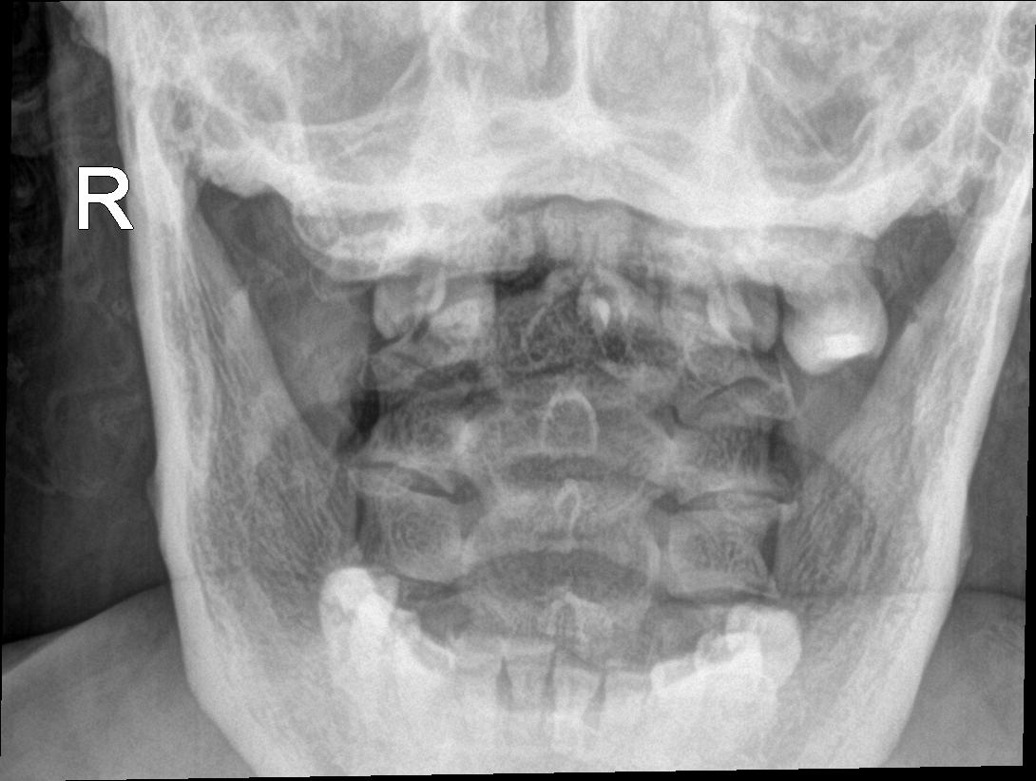

[c-spine swimmers]
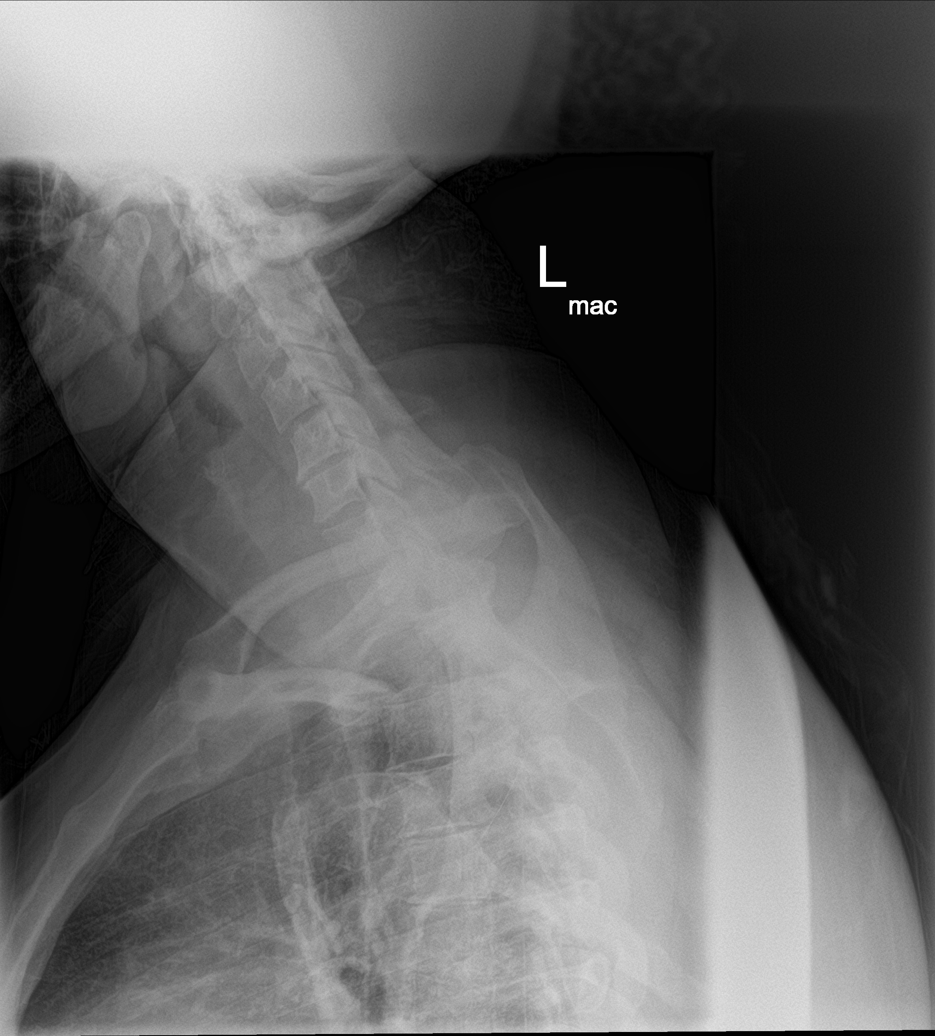

[[person_name]]
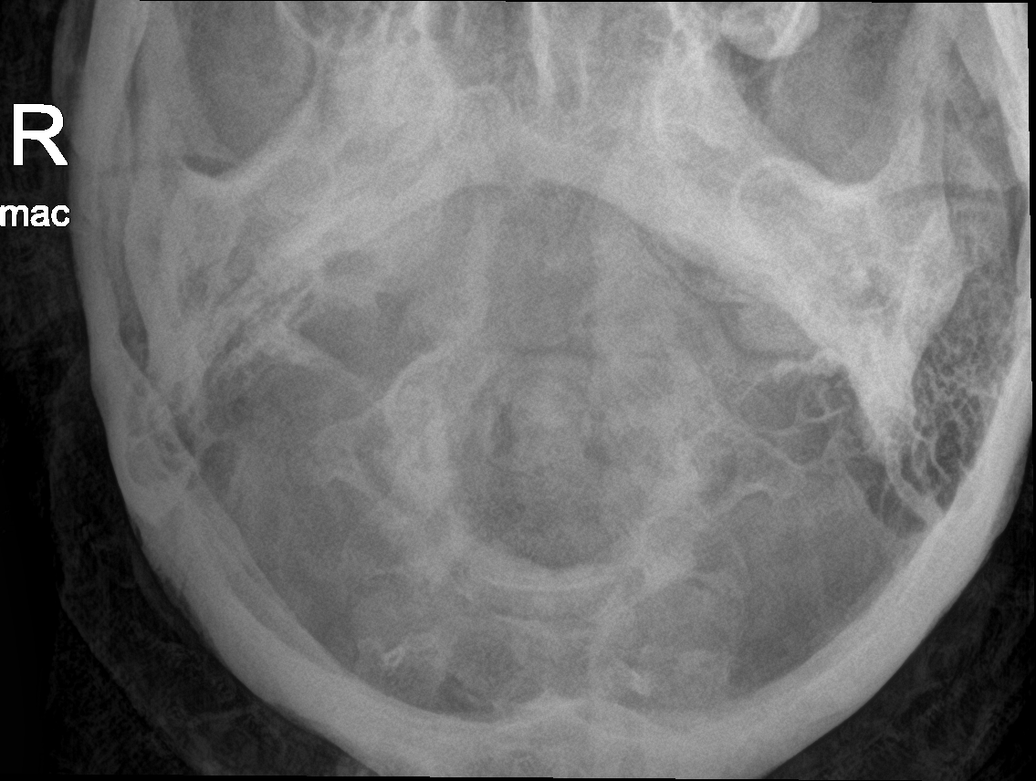

[5 of 5 positions shown; findings below may reference images not displayed]

FINDINGS: Cervical spine alignment is maintained. Disc space narrowing with
endplate spurring at C5-C6, not significantly changed from prior
exam. Vertebral body heights are preserved. The dens is intact.
Posterior elements appear well-aligned. AP view slightly obscured by
overlying artifact. There is no evidence of fracture. No
prevertebral soft tissue edema. Rightward tracheal deviation is
unchanged from prior exam.
IMPRESSION: Degenerative disc disease at C5-C6, not significantly changed from
1560 radiographs. No evidence of acute osseous abnormality.

## 2020-11-16 ENCOUNTER — Telehealth: Payer: Self-pay | Admitting: Neurology

## 2020-11-16 NOTE — Telephone Encounter (Signed)
[facility] called to schedule a BOTOX delivery for pt. Please call back when available.

## 2020-11-17 NOTE — Telephone Encounter (Signed)
Patient has a Botox appointment 6/23. I called [facility] and spoke with Seline to schedule Botox delivery. Botox TBD 6/16.

## 2020-11-19 NOTE — Telephone Encounter (Signed)
Received (1) 200 unit vial of Botox today from [facility].

## 2020-11-25 NOTE — Progress Notes (Addendum)
11/25/20 ALL: This is her second Botox procedure. She does feel that Botox has helped decrease intensity of headaches. She has about 8-12 migraines per month that are usually aborted with Aleve or Ibuprofen and rest. She denies adverse effects from last Botox procedure.     Consent Form Botulism Toxin Injection For Chronic Migraine    Reviewed orally with patient, additionally signature is on file:  Botulism toxin has been approved by the Federal drug administration for treatment of chronic migraine. Botulism toxin does not cure chronic migraine and it may not be effective in some patients.  The administration of botulism toxin is accomplished by injecting a small amount of toxin into the muscles of the neck and head. Dosage must be titrated for each individual. Any benefits resulting from botulism toxin tend to wear off after 3 months with a repeat injection required if benefit is to be maintained. Injections are usually done every 3-4 months with maximum effect peak achieved by about 2 or 3 weeks. Botulism toxin is expensive and you should be sure of what costs you will incur resulting from the injection.  The side effects of botulism toxin use for chronic migraine may include:   -Transient, and usually mild, facial weakness with facial injections  -Transient, and usually mild, head or neck weakness with head/neck injections  -Reduction or loss of forehead facial animation due to forehead muscle weakness  -Eyelid drooping  -Dry eye  -Pain at the site of injection or bruising at the site of injection  -Double vision  -Potential unknown long term risks   Contraindications: You should not have Botox if you are pregnant, nursing, allergic to albumin, have an infection, skin condition, or muscle weakness at the site of the injection, or have myasthenia gravis, Lambert-Eaton syndrome, or ALS.  It is also possible that as with any injection, there may be an allergic reaction or no  effect from the medication. Reduced effectiveness after repeated injections is sometimes seen and rarely infection at the injection site may occur. All care will be taken to prevent these side effects. If therapy is given over a long time, atrophy and wasting in the muscle injected may occur. Occasionally the patient's become refractory to treatment because they develop antibodies to the toxin. In this event, therapy needs to be modified.  I have read the above information and consent to the administration of botulism toxin.    BOTOX PROCEDURE NOTE FOR MIGRAINE HEADACHE  Contraindications and precautions discussed with patient(above). Aseptic procedure was observed and patient tolerated procedure. Procedure performed by Shawnie Dapper, FNP-C.   The condition has existed for more than 6 months, and pt does not have a diagnosis of ALS, Myasthenia Gravis or Lambert-Eaton Syndrome.  Risks and benefits of injections discussed and pt agrees to proceed with the procedure.  Written consent obtained  These injections are medically necessary. Pt  receives good benefits from these injections. These injections do not cause sedations or hallucinations which the oral therapies may cause.   Description of procedure:  The patient was placed in a sitting position. The standard protocol was used for Botox as follows, with 5 units of Botox injected at each site:  -Procerus muscle, midline injection  -Corrugator muscle, bilateral injection  -Frontalis muscle, bilateral injection, with 2 sites each side, medial injection was performed in the upper one third of the frontalis muscle, in the region vertical from the medial inferior edge of the superior orbital rim. The lateral injection was again in  the upper one third of the forehead vertically above the lateral limbus of the cornea, 1.5 cm lateral to the medial injection site.  -Temporalis muscle injection, 4 sites, bilaterally. The first injection was 3 cm above the  tragus of the ear, second injection site was 1.5 cm to 3 cm up from the first injection site in line with the tragus of the ear. The third injection site was 1.5-3 cm forward between the first 2 injection sites. The fourth injection site was 1.5 cm posterior to the second injection site. 5th site laterally in the temporalis  muscleat the level of the outer canthus.  -Occipitalis muscle injection, 3 sites, bilaterally. The first injection was done one half way between the occipital protuberance and the tip of the mastoid process behind the ear. The second injection site was done lateral and superior to the first, 1 fingerbreadth from the first injection. The third injection site was 1 fingerbreadth superiorly and medially from the first injection site.  -Cervical paraspinal muscle injection, 2 sites, bilaterally. The first injection site was 1 cm from the midline of the cervical spine, 3 cm inferior to the lower border of the occipital protuberance. The second injection site was 1.5 cm superiorly and laterally to the first injection site.  -Trapezius muscle injection was performed at 3 sites, bilaterally. The first injection site was in the upper trapezius muscle halfway between the inflection point of the neck, and the acromion. The second injection site was one half way between the acromion and the first injection site. The third injection was done between the first injection site and the inflection point of the neck.   Will return for repeat injection in 3 months.   A total of 200 units of Botox was prepared, 155 units of Botox was injected as documented above, any Botox not injected was wasted. The patient tolerated the procedure well, there were no complications of the above procedure.  agree with procedure   Naomie Dean, MD [facility]

## 2020-11-26 ENCOUNTER — Ambulatory Visit: Payer: Self-pay | Admitting: Adult Health

## 2020-11-26 ENCOUNTER — Other Ambulatory Visit: Payer: Self-pay

## 2020-11-26 ENCOUNTER — Ambulatory Visit: Payer: Medicaid Other | Admitting: Family Medicine

## 2020-11-26 DIAGNOSIS — G43711 Chronic migraine without aura, intractable, with status migrainosus: Secondary | ICD-10-CM | POA: Diagnosis not present

## 2020-11-26 NOTE — Progress Notes (Signed)
Botox- 200 units x 1 vial Lot: [id number] Expiration: 06/2023 NDC: 5409-8119-14  Bacteriostatic 0.9% Sodium Chloride- 52mL total Lot: [id number] Expiration: 11/04/2021 NDC: 2130-8657-84  Dx: O96.295 S/P

## 2021-02-04 ENCOUNTER — Telehealth: Payer: Self-pay | Admitting: Family Medicine

## 2021-02-04 NOTE — Telephone Encounter (Signed)
Patient's next Botox appointment is 10/3. PA with Medicaid for Botox expired late August. I filled out PA form and submitted to Medicaid. PA was approved. PA #[id number] (02/03/21- 01/29/22). 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

## 2021-02-17 NOTE — Telephone Encounter (Signed)
Received (1) 200 unit vial of Botox today from [facility]. 

## 2021-03-08 ENCOUNTER — Encounter: Payer: Self-pay | Admitting: Family Medicine

## 2021-03-08 ENCOUNTER — Ambulatory Visit: Payer: Medicaid Other | Admitting: Family Medicine

## 2021-03-08 DIAGNOSIS — G43711 Chronic migraine without aura, intractable, with status migrainosus: Secondary | ICD-10-CM

## 2021-03-08 NOTE — Progress Notes (Signed)
Botox- 200 units x 1 vial Lot: [id number] Expiration: 02/25 NDC: 0023-3921-02  0.9% Sodium Chloride- 4mL total Lot: [id number] Expiration: 06/06/22 NDC: 0409-4888-02  Dx: G43.711 S/P  

## 2021-03-08 NOTE — Progress Notes (Signed)
03/08/21 ALL: She continues to do well with Botox procedures. Baseline daily headaches with > 15 migraine days every month. Now, having 8-10 headaches a month. She feels that they get worse towards the last two weeks before next Botox procedure. She is working with PCP to treat insomnia. She is in with psychology following the death of her daughter.   07-Dec-2020 ALL: This is her second Botox procedure. She does feel that Botox has helped decrease intensity of headaches. She has about 8-12 migraines per month that are usually aborted with Aleve or Ibuprofen and rest. She denies adverse effects from last Botox procedure.     Consent Form Botulism Toxin Injection For Chronic Migraine    Reviewed orally with patient, additionally signature is on file:  Botulism toxin has been approved by the Federal drug administration for treatment of chronic migraine. Botulism toxin does not cure chronic migraine and it may not be effective in some patients.  The administration of botulism toxin is accomplished by injecting a small amount of toxin into the muscles of the neck and head. Dosage must be titrated for each individual. Any benefits resulting from botulism toxin tend to wear off after 3 months with a repeat injection required if benefit is to be maintained. Injections are usually done every 3-4 months with maximum effect peak achieved by about 2 or 3 weeks. Botulism toxin is expensive and you should be sure of what costs you will incur resulting from the injection.  The side effects of botulism toxin use for chronic migraine may include:   -Transient, and usually mild, facial weakness with facial injections  -Transient, and usually mild, head or neck weakness with head/neck injections  -Reduction or loss of forehead facial animation due to forehead muscle weakness  -Eyelid drooping  -Dry eye  -Pain at the site of injection or bruising at the site of injection  -Double vision  -Potential unknown  long term risks   Contraindications: You should not have Botox if you are pregnant, nursing, allergic to albumin, have an infection, skin condition, or muscle weakness at the site of the injection, or have myasthenia gravis, Lambert-Eaton syndrome, or ALS.  It is also possible that as with any injection, there may be an allergic reaction or no effect from the medication. Reduced effectiveness after repeated injections is sometimes seen and rarely infection at the injection site may occur. All care will be taken to prevent these side effects. If therapy is given over a long time, atrophy and wasting in the muscle injected may occur. Occasionally the patient's become refractory to treatment because they develop antibodies to the toxin. In this event, therapy needs to be modified.  I have read the above information and consent to the administration of botulism toxin.    BOTOX PROCEDURE NOTE FOR MIGRAINE HEADACHE  Contraindications and precautions discussed with patient(above). Aseptic procedure was observed and patient tolerated procedure. Procedure performed by Shawnie Dapper, FNP-C.   The condition has existed for more than 6 months, and pt does not have a diagnosis of ALS, Myasthenia Gravis or Lambert-Eaton Syndrome.  Risks and benefits of injections discussed and pt agrees to proceed with the procedure.  Written consent obtained  These injections are medically necessary. Pt  receives good benefits from these injections. These injections do not cause sedations or hallucinations which the oral therapies may cause.   Description of procedure:  The patient was placed in a sitting position. The standard protocol was used for Botox as follows,  with 5 units of Botox injected at each site:  -Procerus muscle, midline injection  -Corrugator muscle, bilateral injection  -Frontalis muscle, bilateral injection, with 2 sites each side, medial injection was performed in the upper one third of the frontalis  muscle, in the region vertical from the medial inferior edge of the superior orbital rim. The lateral injection was again in the upper one third of the forehead vertically above the lateral limbus of the cornea, 1.5 cm lateral to the medial injection site.  -Temporalis muscle injection, 4 sites, bilaterally. The first injection was 3 cm above the tragus of the ear, second injection site was 1.5 cm to 3 cm up from the first injection site in line with the tragus of the ear. The third injection site was 1.5-3 cm forward between the first 2 injection sites. The fourth injection site was 1.5 cm posterior to the second injection site. 5th site laterally in the temporalis  muscleat the level of the outer canthus.  -Occipitalis muscle injection, 3 sites, bilaterally. The first injection was done one half way between the occipital protuberance and the tip of the mastoid process behind the ear. The second injection site was done lateral and superior to the first, 1 fingerbreadth from the first injection. The third injection site was 1 fingerbreadth superiorly and medially from the first injection site.  -Cervical paraspinal muscle injection, 2 sites, bilaterally. The first injection site was 1 cm from the midline of the cervical spine, 3 cm inferior to the lower border of the occipital protuberance. The second injection site was 1.5 cm superiorly and laterally to the first injection site.  -Trapezius muscle injection was performed at 3 sites, bilaterally. The first injection site was in the upper trapezius muscle halfway between the inflection point of the neck, and the acromion. The second injection site was one half way between the acromion and the first injection site. The third injection was done between the first injection site and the inflection point of the neck.   Will return for repeat injection in 3 months.   A total of 200 units of Botox was prepared, 155 units of Botox was injected as documented  above, any Botox not injected was wasted. The patient tolerated the procedure well, there were no complications of the above procedure.

## 2021-05-25 ENCOUNTER — Telehealth: Payer: Self-pay | Admitting: Family Medicine

## 2021-05-25 NOTE — Telephone Encounter (Signed)
Patient is scheduled for next Botox in January. Received call from [facility] stating PA is needed from [facility] before Botox delivery can be scheduled. Patient no longer has the [facility]. I completed designated PA form and will give to NP to sign.

## 2021-05-27 NOTE — Telephone Encounter (Signed)
Received approval. PA #[id number] (05/24/21- 11/24/21). I called [facility] and provided PA.

## 2021-06-08 NOTE — Telephone Encounter (Signed)
Patient was on the schedule to see Amy for Botox on 1/5. Amy will be out of office. I called patient and LVM advising appt will be moved to 1/9 at 2:30. Requested patient call back if new appt does not work for her.

## 2021-06-09 NOTE — Telephone Encounter (Signed)
Received (1) 200 unit vial of Botox today from [facility] [phone]).

## 2021-06-10 ENCOUNTER — Ambulatory Visit: Payer: Medicaid Other | Admitting: Family Medicine

## 2021-06-10 NOTE — Telephone Encounter (Signed)
Called patient again this morning to make sure she is aware of appointment change, patient did not answer.

## 2021-06-14 ENCOUNTER — Ambulatory Visit: Payer: Medicaid Other | Admitting: Family Medicine

## 2021-06-14 DIAGNOSIS — G43711 Chronic migraine without aura, intractable, with status migrainosus: Secondary | ICD-10-CM | POA: Diagnosis not present

## 2021-06-14 NOTE — Progress Notes (Signed)
Botox- 200 units x 1 vial Lot: [id number] Expiration: 02/2024 NDC: 7654-6503-54  Bacteriostatic 0.9% Sodium Chloride- 52mL total Lot: [id number] Expiration: 01/05/2023 NDC: 7517-0017-49  Dx: S49.675 S/P

## 2021-06-14 NOTE — Progress Notes (Signed)
06/14/21 ALL: Phyllis Moore returns for Botox. This is her 4th procedure. She is doing well. She has about 5 headaches per month. Ibuprofen works well for abortive therapy. She continues to work with psychology.   03/08/2021 ALL: She continues to do well with Botox procedures. Baseline daily headaches with > 15 migraine days every month. Now, having 8-10 headaches a month. She feels that they get worse towards the last two weeks before next Botox procedure. She is working with PCP to treat insomnia. She is in with psychology following the death of her daughter.   12-10-20 ALL: This is her second Botox procedure. She does feel that Botox has helped decrease intensity of headaches. She has about 8-12 migraines per month that are usually aborted with Aleve or Ibuprofen and rest. She denies adverse effects from last Botox procedure.     Consent Form Botulism Toxin Injection For Chronic Migraine    Reviewed orally with patient, additionally signature is on file:  Botulism toxin has been approved by the Federal drug administration for treatment of chronic migraine. Botulism toxin does not cure chronic migraine and it may not be effective in some patients.  The administration of botulism toxin is accomplished by injecting a small amount of toxin into the muscles of the neck and head. Dosage must be titrated for each individual. Any benefits resulting from botulism toxin tend to wear off after 3 months with a repeat injection required if benefit is to be maintained. Injections are usually done every 3-4 months with maximum effect peak achieved by about 2 or 3 weeks. Botulism toxin is expensive and you should be sure of what costs you will incur resulting from the injection.  The side effects of botulism toxin use for chronic migraine may include:   -Transient, and usually mild, facial weakness with facial injections  -Transient, and usually mild, head or neck weakness with head/neck  injections  -Reduction or loss of forehead facial animation due to forehead muscle weakness  -Eyelid drooping  -Dry eye  -Pain at the site of injection or bruising at the site of injection  -Double vision  -Potential unknown long term risks   Contraindications: You should not have Botox if you are pregnant, nursing, allergic to albumin, have an infection, skin condition, or muscle weakness at the site of the injection, or have myasthenia gravis, Lambert-Eaton syndrome, or ALS.  It is also possible that as with any injection, there may be an allergic reaction or no effect from the medication. Reduced effectiveness after repeated injections is sometimes seen and rarely infection at the injection site may occur. All care will be taken to prevent these side effects. If therapy is given over a long time, atrophy and wasting in the muscle injected may occur. Occasionally the patient's become refractory to treatment because they develop antibodies to the toxin. In this event, therapy needs to be modified.  I have read the above information and consent to the administration of botulism toxin.    BOTOX PROCEDURE NOTE FOR MIGRAINE HEADACHE  Contraindications and precautions discussed with patient(above). Aseptic procedure was observed and patient tolerated procedure. Procedure performed by Shawnie Dapper, FNP-C.   The condition has existed for more than 6 months, and pt does not have a diagnosis of ALS, Myasthenia Gravis or Lambert-Eaton Syndrome.  Risks and benefits of injections discussed and pt agrees to proceed with the procedure.  Written consent obtained  These injections are medically necessary. Pt  receives good benefits from these injections. These  injections do not cause sedations or hallucinations which the oral therapies may cause.   Description of procedure:  The patient was placed in a sitting position. The standard protocol was used for Botox as follows, with 5 units of Botox injected at  each site:  -Procerus muscle, midline injection  -Corrugator muscle, bilateral injection  -Frontalis muscle, bilateral injection, with 2 sites each side, medial injection was performed in the upper one third of the frontalis muscle, in the region vertical from the medial inferior edge of the superior orbital rim. The lateral injection was again in the upper one third of the forehead vertically above the lateral limbus of the cornea, 1.5 cm lateral to the medial injection site.  -Temporalis muscle injection, 4 sites, bilaterally. The first injection was 3 cm above the tragus of the ear, second injection site was 1.5 cm to 3 cm up from the first injection site in line with the tragus of the ear. The third injection site was 1.5-3 cm forward between the first 2 injection sites. The fourth injection site was 1.5 cm posterior to the second injection site. 5th site laterally in the temporalis  muscleat the level of the outer canthus.  -Occipitalis muscle injection, 3 sites, bilaterally. The first injection was done one half way between the occipital protuberance and the tip of the mastoid process behind the ear. The second injection site was done lateral and superior to the first, 1 fingerbreadth from the first injection. The third injection site was 1 fingerbreadth superiorly and medially from the first injection site.  -Cervical paraspinal muscle injection, 2 sites, bilaterally. The first injection site was 1 cm from the midline of the cervical spine, 3 cm inferior to the lower border of the occipital protuberance. The second injection site was 1.5 cm superiorly and laterally to the first injection site.  -Trapezius muscle injection was performed at 3 sites, bilaterally. The first injection site was in the upper trapezius muscle halfway between the inflection point of the neck, and the acromion. The second injection site was one half way between the acromion and the first injection site. The third  injection was done between the first injection site and the inflection point of the neck.   Will return for repeat injection in 3 months.   A total of 200 units of Botox was prepared, 155 units of Botox was injected as documented above, any Botox not injected was wasted. The patient tolerated the procedure well, there were no complications of the above procedure.

## 2021-08-24 ENCOUNTER — Telehealth: Payer: Self-pay | Admitting: Family Medicine

## 2021-08-24 MED ORDER — BOTOX 200 UNITS IJ SOLR: 200.0000 [IU] | INTRAMUSCULAR | 3 refills | Status: DC

## 2021-08-24 NOTE — Telephone Encounter (Signed)
Please send Botox Rx refill to [facility]. ?

## 2021-08-24 NOTE — Telephone Encounter (Signed)
Script sent to [facility] ?

## 2021-08-31 NOTE — Telephone Encounter (Signed)
Completed [employer] Botox form, placed in Nurse Pod for MD signature. ?

## 2021-09-09 NOTE — Telephone Encounter (Signed)
Received approval from [facility], [place] # [id number] (09/09/2021-09/09/2022). ?

## 2021-09-14 ENCOUNTER — Ambulatory Visit: Payer: Medicaid Other | Admitting: Family Medicine

## 2021-09-14 DIAGNOSIS — G43711 Chronic migraine without aura, intractable, with status migrainosus: Secondary | ICD-10-CM | POA: Diagnosis not present

## 2021-09-14 NOTE — Progress Notes (Signed)
Botox- 200 units x 1 vial ?Lot: [id number] ?Expiration: 02/2024 ?NDC: [phone] ? ?Bacteriostatic 0.9% Sodium Chloride- 81mL total ?Lot: [id number] ?Expiration: 01/05/2023 ?NDC: 5364-6803-21 ? ?Dx: Y24.825 ?B/B  ?

## 2021-09-14 NOTE — Progress Notes (Signed)
? ? ?09/14/21 ALL: Magdalene returns for Botox. She is doing well. May have 4-5 milder migrainous headaches per month. She is able to abort headache with ibuprofen. She is doing well and without concerns.  ? ?06/14/2021 ALL: Brittni returns for Botox. This is her 4th procedure. She is doing well. She has about 5 headaches per month. Ibuprofen works well for abortive therapy. She continues to work with psychology.  ? ?03/08/2021 ALL: She continues to do well with Botox procedures. Baseline daily headaches with > 15 migraine days every month. Now, having 8-10 headaches a month. She feels that they get worse towards the last two weeks before next Botox procedure. She is working with PCP to treat insomnia. She is in with psychology following the death of her daughter.  ? ?12/09/2020 ALL: This is her second Botox procedure. She does feel that Botox has helped decrease intensity of headaches. She has about 8-12 migraines per month that are usually aborted with Aleve or Ibuprofen and rest. She denies adverse effects from last Botox procedure.  ? ? ? ?Consent Form ?Botulism Toxin Injection For Chronic Migraine ? ? ? ?Reviewed orally with patient, additionally signature is on file: ? ?Botulism toxin has been approved by the Federal drug administration for treatment of chronic migraine. Botulism toxin does not cure chronic migraine and it may not be effective in some patients. ? ?The administration of botulism toxin is accomplished by injecting a small amount of toxin into the muscles of the neck and head. Dosage must be titrated for each individual. Any benefits resulting from botulism toxin tend to wear off after 3 months with a repeat injection required if benefit is to be maintained. Injections are usually done every 3-4 months with maximum effect peak achieved by about 2 or 3 weeks. Botulism toxin is expensive and you should be sure of what costs you will incur resulting from the injection. ? ?The side effects of botulism toxin  use for chronic migraine may include: ? ? -Transient, and usually mild, facial weakness with facial injections ? -Transient, and usually mild, head or neck weakness with head/neck injections ? -Reduction or loss of forehead facial animation due to forehead muscle weakness ? -Eyelid drooping ? -Dry eye ? -Pain at the site of injection or bruising at the site of injection ? -Double vision ? -Potential unknown long term risks ? ? ?Contraindications: You should not have Botox if you are pregnant, nursing, allergic to albumin, have an infection, skin condition, or muscle weakness at the site of the injection, or have myasthenia gravis, Lambert-Eaton syndrome, or ALS. ? ?It is also possible that as with any injection, there may be an allergic reaction or no effect from the medication. Reduced effectiveness after repeated injections is sometimes seen and rarely infection at the injection site may occur. All care will be taken to prevent these side effects. If therapy is given over a long time, atrophy and wasting in the muscle injected may occur. Occasionally the patient's become refractory to treatment because they develop antibodies to the toxin. In this event, therapy needs to be modified. ? ?I have read the above information and consent to the administration of botulism toxin. ? ? ? ?BOTOX PROCEDURE NOTE FOR MIGRAINE HEADACHE ? ?Contraindications and precautions discussed with patient(above). Aseptic procedure was observed and patient tolerated procedure. Procedure performed by Shawnie Dapper, FNP-C.  ? ?The condition has existed for more than 6 months, and pt does not have a diagnosis of ALS, Myasthenia Gravis or Lambert-Eaton  Syndrome.  Risks and benefits of injections discussed and pt agrees to proceed with the procedure.  Written consent obtained ? ?These injections are medically necessary. Pt  receives good benefits from these injections. These injections do not cause sedations or hallucinations which the oral  therapies may cause. ? ? ?Description of procedure: ? ?The patient was placed in a sitting position. The standard protocol was used for Botox as follows, with 5 units of Botox injected at each site: ? ?-Procerus muscle, midline injection ? ?-Corrugator muscle, bilateral injection ? ?-Frontalis muscle, bilateral injection, with 2 sites each side, medial injection was performed in the upper one third of the frontalis muscle, in the region vertical from the medial inferior edge of the superior orbital rim. The lateral injection was again in the upper one third of the forehead vertically above the lateral limbus of the cornea, 1.5 cm lateral to the medial injection site. ? ?-Temporalis muscle injection, 4 sites, bilaterally. The first injection was 3 cm above the tragus of the ear, second injection site was 1.5 cm to 3 cm up from the first injection site in line with the tragus of the ear. The third injection site was 1.5-3 cm forward between the first 2 injection sites. The fourth injection site was 1.5 cm posterior to the second injection site. 5th site laterally in the temporalis  muscleat the level of the outer canthus. ? ?-Occipitalis muscle injection, 3 sites, bilaterally. The first injection was done one half way between the occipital protuberance and the tip of the mastoid process behind the ear. The second injection site was done lateral and superior to the first, 1 fingerbreadth from the first injection. The third injection site was 1 fingerbreadth superiorly and medially from the first injection site. ? ?-Cervical paraspinal muscle injection, 2 sites, bilaterally. The first injection site was 1 cm from the midline of the cervical spine, 3 cm inferior to the lower border of the occipital protuberance. The second injection site was 1.5 cm superiorly and laterally to the first injection site. ? ?-Trapezius muscle injection was performed at 3 sites, bilaterally. The first injection site was in the upper trapezius  muscle halfway between the inflection point of the neck, and the acromion. The second injection site was one half way between the acromion and the first injection site. The third injection was done between the first injection site and the inflection point of the neck. ? ? ?Will return for repeat injection in 3 months. ? ? ?A total of 200 units of Botox was prepared, 155 units of Botox was injected as documented above, any Botox not injected was wasted. The patient tolerated the procedure well, there were no complications of the above procedure. ? ? ?

## 2021-11-24 ENCOUNTER — Other Ambulatory Visit (HOSPITAL_COMMUNITY): Payer: Self-pay | Admitting: *Deleted

## 2021-11-24 DIAGNOSIS — R079 Chest pain, unspecified: Secondary | ICD-10-CM

## 2021-12-14 ENCOUNTER — Encounter: Payer: Self-pay | Admitting: Family Medicine

## 2021-12-14 ENCOUNTER — Ambulatory Visit: Payer: Medicaid Other | Admitting: Family Medicine

## 2021-12-14 DIAGNOSIS — G43711 Chronic migraine without aura, intractable, with status migrainosus: Secondary | ICD-10-CM

## 2021-12-14 NOTE — Progress Notes (Signed)
12/14/21 ALL: Phyllis Moore returns for Botox. She is doing well. She has about 4 migraines per month. Botox helps significantly.   09/14/2021 ALL: Phyllis Moore returns for Botox. She is doing well. May have 4-5 milder migrainous headaches per month. She is able to abort headache with ibuprofen. She is doing well and without concerns.   06/14/2021 ALL: Phyllis Moore returns for Botox. This is her 4th procedure. She is doing well. She has about 5 headaches per month. Ibuprofen works well for abortive therapy. She continues to work with psychology.   03/08/2021 ALL: She continues to do well with Botox procedures. Baseline daily headaches with > 15 migraine days every month. Now, having 8-10 headaches a month. She feels that they get worse towards the last two weeks before next Botox procedure. She is working with PCP to treat insomnia. She is in with psychology following the death of her daughter.   2020-12-11 ALL: This is her second Botox procedure. She does feel that Botox has helped decrease intensity of headaches. She has about 8-12 migraines per month that are usually aborted with Aleve or Ibuprofen and rest. She denies adverse effects from last Botox procedure.    Consent Form Botulism Toxin Injection For Chronic Migraine    Reviewed orally with patient, additionally signature is on file:  Botulism toxin has been approved by the Federal drug administration for treatment of chronic migraine. Botulism toxin does not cure chronic migraine and it may not be effective in some patients.  The administration of botulism toxin is accomplished by injecting a small amount of toxin into the muscles of the neck and head. Dosage must be titrated for each individual. Any benefits resulting from botulism toxin tend to wear off after 3 months with a repeat injection required if benefit is to be maintained. Injections are usually done every 3-4 months with maximum effect peak achieved by about 2 or 3 weeks. Botulism toxin is  expensive and you should be sure of what costs you will incur resulting from the injection.  The side effects of botulism toxin use for chronic migraine may include:   -Transient, and usually mild, facial weakness with facial injections  -Transient, and usually mild, head or neck weakness with head/neck injections  -Reduction or loss of forehead facial animation due to forehead muscle weakness  -Eyelid drooping  -Dry eye  -Pain at the site of injection or bruising at the site of injection  -Double vision  -Potential unknown long term risks   Contraindications: You should not have Botox if you are pregnant, nursing, allergic to albumin, have an infection, skin condition, or muscle weakness at the site of the injection, or have myasthenia gravis, Lambert-Eaton syndrome, or ALS.  It is also possible that as with any injection, there may be an allergic reaction or no effect from the medication. Reduced effectiveness after repeated injections is sometimes seen and rarely infection at the injection site may occur. All care will be taken to prevent these side effects. If therapy is given over a long time, atrophy and wasting in the muscle injected may occur. Occasionally the patient's become refractory to treatment because they develop antibodies to the toxin. In this event, therapy needs to be modified.  I have read the above information and consent to the administration of botulism toxin.    BOTOX PROCEDURE NOTE FOR MIGRAINE HEADACHE  Contraindications and precautions discussed with patient(above). Aseptic procedure was observed and patient tolerated procedure. Procedure performed by Shawnie Dapper, FNP-C.   The  condition has existed for more than 6 months, and pt does not have a diagnosis of ALS, Myasthenia Gravis or Lambert-Eaton Syndrome.  Risks and benefits of injections discussed and pt agrees to proceed with the procedure.  Written consent obtained  These injections are medically necessary.  Pt  receives good benefits from these injections. These injections do not cause sedations or hallucinations which the oral therapies may cause.   Description of procedure:  The patient was placed in a sitting position. The standard protocol was used for Botox as follows, with 5 units of Botox injected at each site:  -Procerus muscle, midline injection  -Corrugator muscle, bilateral injection  -Frontalis muscle, bilateral injection, with 2 sites each side, medial injection was performed in the upper one third of the frontalis muscle, in the region vertical from the medial inferior edge of the superior orbital rim. The lateral injection was again in the upper one third of the forehead vertically above the lateral limbus of the cornea, 1.5 cm lateral to the medial injection site.  -Temporalis muscle injection, 4 sites, bilaterally. The first injection was 3 cm above the tragus of the ear, second injection site was 1.5 cm to 3 cm up from the first injection site in line with the tragus of the ear. The third injection site was 1.5-3 cm forward between the first 2 injection sites. The fourth injection site was 1.5 cm posterior to the second injection site. 5th site laterally in the temporalis  muscleat the level of the outer canthus.  -Occipitalis muscle injection, 3 sites, bilaterally. The first injection was done one half way between the occipital protuberance and the tip of the mastoid process behind the ear. The second injection site was done lateral and superior to the first, 1 fingerbreadth from the first injection. The third injection site was 1 fingerbreadth superiorly and medially from the first injection site.  -Cervical paraspinal muscle injection, 2 sites, bilaterally. The first injection site was 1 cm from the midline of the cervical spine, 3 cm inferior to the lower border of the occipital protuberance. The second injection site was 1.5 cm superiorly and laterally to the first injection  site.  -Trapezius muscle injection was performed at 3 sites, bilaterally. The first injection site was in the upper trapezius muscle halfway between the inflection point of the neck, and the acromion. The second injection site was one half way between the acromion and the first injection site. The third injection was done between the first injection site and the inflection point of the neck.   Will return for repeat injection in 3 months.   A total of 200 units of Botox was prepared, 155 units of Botox was injected as documented above, any Botox not injected was wasted. The patient tolerated the procedure well, there were no complications of the above procedure.

## 2021-12-14 NOTE — Progress Notes (Signed)
Botox- 200 units x 1 vial Lot: [id number] Expiration: 07/2024 NDC: 0023-3921-02  Bacteriostatic 0.9% Sodium Chloride- 4mL total Lot: [id number] Expiration: 02/05/2023 NDC: 0409-1966-02  Dx: G43.711 B/B  

## 2021-12-21 ENCOUNTER — Other Ambulatory Visit (HOSPITAL_COMMUNITY): Payer: Medicaid Other

## 2021-12-28 ENCOUNTER — Telehealth: Payer: Self-pay | Admitting: Family Medicine

## 2021-12-28 NOTE — Telephone Encounter (Signed)
Want to know if we you are going to do a buy in bill or they need to set the order up for the pt. She is requesting a return call

## 2021-12-29 ENCOUNTER — Telehealth (HOSPITAL_COMMUNITY): Payer: Self-pay | Admitting: *Deleted

## 2021-12-29 ENCOUNTER — Other Ambulatory Visit (HOSPITAL_COMMUNITY): Payer: Self-pay | Admitting: *Deleted

## 2021-12-29 MED ORDER — METOPROLOL TARTRATE 50 MG PO TABS: ORAL_TABLET | ORAL | 0 refills | Status: DC

## 2021-12-29 NOTE — Telephone Encounter (Signed)
Reaching out to patient to offer assistance regarding upcoming cardiac imaging study; pt verbalizes understanding of appt date/time. However, she wishes to reschedule. New appointment made for Aug 11 at 3pm.  Pharmacy verified with patient and a one time dose of 50mg  metoprolol tartrate was sent to the patient's pharmacy per protocol. She verbalized understanding.  RN Navigator Cardiac Imaging [facility] [phone] office [phone] cell

## 2021-12-30 ENCOUNTER — Ambulatory Visit (HOSPITAL_COMMUNITY): Admission: RE | Admit: 2021-12-30 | Payer: Medicaid Other | Source: Ambulatory Visit

## 2022-01-12 NOTE — Telephone Encounter (Signed)
I called [facility], stated pt can be speciality. I called [facility] since that is who pt has been using for Botox. They will reach out to the patient to proceed.

## 2022-01-13 ENCOUNTER — Telehealth (HOSPITAL_COMMUNITY): Payer: Self-pay | Admitting: *Deleted

## 2022-01-13 NOTE — Telephone Encounter (Signed)
Attempted to call patient regarding upcoming cardiac CT appointment. °Left message on voicemail with name and callback number ° °Julisa Flippo RN Navigator Cardiac Imaging °[facility] °[phone] Office °[phone] Cell ° °

## 2022-01-14 ENCOUNTER — Other Ambulatory Visit: Payer: Self-pay | Admitting: Cardiology

## 2022-01-14 ENCOUNTER — Ambulatory Visit (HOSPITAL_COMMUNITY)
Admission: RE | Admit: 2022-01-14 | Discharge: 2022-01-14 | Disposition: A | Discharge status: Home / Self Care | Payer: Medicaid Other | Source: Ambulatory Visit | Attending: Family Medicine | Admitting: Family Medicine

## 2022-01-14 DIAGNOSIS — I2510 Atherosclerotic heart disease of native coronary artery without angina pectoris: Secondary | ICD-10-CM | POA: Diagnosis not present

## 2022-01-14 DIAGNOSIS — R931 Abnormal findings on diagnostic imaging of heart and coronary circulation: Secondary | ICD-10-CM

## 2022-01-14 DIAGNOSIS — R079 Chest pain, unspecified: Secondary | ICD-10-CM | POA: Diagnosis present

## 2022-01-14 LAB — POCT I-STAT CREATININE: Creatinine, Ser: 0.8 mg/dL (ref 0.44–1.00)

## 2022-01-14 MED ORDER — IOHEXOL 350 MG/ML SOLN
100.0000 mL | Freq: Once | INTRAVENOUS | Status: AC | PRN
  Administered 2022-01-14: 100 mL via INTRAVENOUS

## 2022-01-14 MED ORDER — NITROGLYCERIN 0.4 MG SL SUBL
SUBLINGUAL_TABLET | SUBLINGUAL | Status: AC
  Filled 2022-01-14: qty 2

## 2022-01-14 MED ORDER — NITROGLYCERIN 0.4 MG SL SUBL
0.8000 mg | SUBLINGUAL_TABLET | Freq: Once | SUBLINGUAL | Status: AC
  Administered 2022-01-14: 0.8 mg via SUBLINGUAL

## 2022-01-15 ENCOUNTER — Ambulatory Visit (HOSPITAL_BASED_OUTPATIENT_CLINIC_OR_DEPARTMENT_OTHER)
Admission: RE | Admit: 2022-01-15 | Discharge: 2022-01-15 | Disposition: A | Discharge status: Home / Self Care | Payer: Medicaid Other | Source: Ambulatory Visit | Attending: Cardiology | Admitting: Cardiology

## 2022-01-15 ENCOUNTER — Ambulatory Visit (HOSPITAL_COMMUNITY)
Admission: RE | Admit: 2022-01-15 | Discharge: 2022-01-15 | Disposition: A | Discharge status: Home / Self Care | Payer: Medicaid Other | Source: Ambulatory Visit | Attending: Cardiology | Admitting: Cardiology

## 2022-01-15 DIAGNOSIS — R931 Abnormal findings on diagnostic imaging of heart and coronary circulation: Secondary | ICD-10-CM | POA: Diagnosis not present

## 2022-02-01 ENCOUNTER — Telehealth: Payer: Self-pay | Admitting: Neurology

## 2022-02-01 NOTE — Telephone Encounter (Signed)
[facility] shipping Tuesday September 5 of delivery September 6. Informed once shipped will be billed.

## 2022-03-04 NOTE — Progress Notes (Unsigned)
03/04/22 ALL: Phyllis Moore returns for Botox. She continues to do well. She has 2-3 migraine days per month. Aborted with ibuprofen.   12/14/2021 ALL: Phyllis Moore returns for Botox. She is doing well. She has about 4 migraines per month. Botox helps significantly.   09/14/2021 ALL: Phyllis Moore returns for Botox. She is doing well. May have 4-5 milder migrainous headaches per month. She is able to abort headache with ibuprofen. She is doing well and without concerns.   06/14/2021 ALL: Phyllis Moore returns for Botox. This is her 4th procedure. She is doing well. She has about 5 headaches per month. Ibuprofen works well for abortive therapy. She continues to work with psychology.   03/08/2021 ALL: She continues to do well with Botox procedures. Baseline daily headaches with > 15 migraine days every month. Now, having 8-10 headaches a month. She feels that they get worse towards the last two weeks before next Botox procedure. She is working with PCP to treat insomnia. She is in with psychology following the death of her daughter.   Dec 22, 2020 ALL: This is her second Botox procedure. She does feel that Botox has helped decrease intensity of headaches. She has about 8-12 migraines per month that are usually aborted with Aleve or Ibuprofen and rest. She denies adverse effects from last Botox procedure.    Consent Form Botulism Toxin Injection For Chronic Migraine    Reviewed orally with patient, additionally signature is on file:  Botulism toxin has been approved by the Federal drug administration for treatment of chronic migraine. Botulism toxin does not cure chronic migraine and it may not be effective in some patients.  The administration of botulism toxin is accomplished by injecting a small amount of toxin into the muscles of the neck and head. Dosage must be titrated for each individual. Any benefits resulting from botulism toxin tend to wear off after 3 months with a repeat injection required if benefit is to be  maintained. Injections are usually done every 3-4 months with maximum effect peak achieved by about 2 or 3 weeks. Botulism toxin is expensive and you should be sure of what costs you will incur resulting from the injection.  The side effects of botulism toxin use for chronic migraine may include:   -Transient, and usually mild, facial weakness with facial injections  -Transient, and usually mild, head or neck weakness with head/neck injections  -Reduction or loss of forehead facial animation due to forehead muscle weakness  -Eyelid drooping  -Dry eye  -Pain at the site of injection or bruising at the site of injection  -Double vision  -Potential unknown long term risks   Contraindications: You should not have Botox if you are pregnant, nursing, allergic to albumin, have an infection, skin condition, or muscle weakness at the site of the injection, or have myasthenia gravis, Lambert-Eaton syndrome, or ALS.  It is also possible that as with any injection, there may be an allergic reaction or no effect from the medication. Reduced effectiveness after repeated injections is sometimes seen and rarely infection at the injection site may occur. All care will be taken to prevent these side effects. If therapy is given over a long time, atrophy and wasting in the muscle injected may occur. Occasionally the patient's become refractory to treatment because they develop antibodies to the toxin. In this event, therapy needs to be modified.  I have read the above information and consent to the administration of botulism toxin.    BOTOX PROCEDURE NOTE FOR MIGRAINE HEADACHE  Contraindications and precautions discussed with patient(above). Aseptic procedure was observed and patient tolerated procedure. Procedure performed by Shawnie Dapper, FNP-C.   The condition has existed for more than 6 months, and pt does not have a diagnosis of ALS, Myasthenia Gravis or Lambert-Eaton Syndrome.  Risks and benefits of  injections discussed and pt agrees to proceed with the procedure.  Written consent obtained  These injections are medically necessary. Pt  receives good benefits from these injections. These injections do not cause sedations or hallucinations which the oral therapies may cause.   Description of procedure:  The patient was placed in a sitting position. The standard protocol was used for Botox as follows, with 5 units of Botox injected at each site:  -Procerus muscle, midline injection  -Corrugator muscle, bilateral injection  -Frontalis muscle, bilateral injection, with 2 sites each side, medial injection was performed in the upper one third of the frontalis muscle, in the region vertical from the medial inferior edge of the superior orbital rim. The lateral injection was again in the upper one third of the forehead vertically above the lateral limbus of the cornea, 1.5 cm lateral to the medial injection site.  -Temporalis muscle injection, 4 sites, bilaterally. The first injection was 3 cm above the tragus of the ear, second injection site was 1.5 cm to 3 cm up from the first injection site in line with the tragus of the ear. The third injection site was 1.5-3 cm forward between the first 2 injection sites. The fourth injection site was 1.5 cm posterior to the second injection site. 5th site laterally in the temporalis  muscleat the level of the outer canthus.  -Occipitalis muscle injection, 3 sites, bilaterally. The first injection was done one half way between the occipital protuberance and the tip of the mastoid process behind the ear. The second injection site was done lateral and superior to the first, 1 fingerbreadth from the first injection. The third injection site was 1 fingerbreadth superiorly and medially from the first injection site.  -Cervical paraspinal muscle injection, 2 sites, bilaterally. The first injection site was 1 cm from the midline of the cervical spine, 3 cm inferior to  the lower border of the occipital protuberance. The second injection site was 1.5 cm superiorly and laterally to the first injection site.  -Trapezius muscle injection was performed at 3 sites, bilaterally. The first injection site was in the upper trapezius muscle halfway between the inflection point of the neck, and the acromion. The second injection site was one half way between the acromion and the first injection site. The third injection was done between the first injection site and the inflection point of the neck.   Will return for repeat injection in 3 months.   A total of 200 units of Botox was prepared, 155 units of Botox was injected as documented above, any Botox not injected was wasted. The patient tolerated the procedure well, there were no complications of the above procedure.

## 2022-03-09 ENCOUNTER — Ambulatory Visit: Payer: Medicaid Other | Admitting: Family Medicine

## 2022-03-09 DIAGNOSIS — G43711 Chronic migraine without aura, intractable, with status migrainosus: Secondary | ICD-10-CM | POA: Diagnosis not present

## 2022-03-09 MED ORDER — ONABOTULINUMTOXINA 200 UNITS IJ SOLR
155.0000 [IU] | Freq: Once | INTRAMUSCULAR | Status: AC
  Administered 2022-03-09: 155 [IU] via INTRAMUSCULAR

## 2022-03-09 NOTE — Progress Notes (Signed)
Botox- 200 units x 1 vial Lot: [id number] Expiration: 07/2024 NDC: 9357-0177-93  Bacteriostatic 0.9% Sodium Chloride- 38mL total Lot: [id number] Expiration: 03/10/2022 NDC: 2330-0762-26  Dx: J33.545. S/P [facility]

## 2022-03-15 ENCOUNTER — Ambulatory Visit: Payer: Medicaid Other | Attending: Cardiovascular Disease | Admitting: Cardiovascular Disease

## 2022-03-15 ENCOUNTER — Encounter: Payer: Self-pay | Admitting: Cardiovascular Disease

## 2022-03-15 VITALS — BP 140/88 | HR 88 | Wt 204.4 lb

## 2022-03-15 DIAGNOSIS — I2510 Atherosclerotic heart disease of native coronary artery without angina pectoris: Secondary | ICD-10-CM | POA: Insufficient documentation

## 2022-03-15 DIAGNOSIS — I25118 Atherosclerotic heart disease of native coronary artery with other forms of angina pectoris: Secondary | ICD-10-CM

## 2022-03-15 DIAGNOSIS — Z01812 Encounter for preprocedural laboratory examination: Secondary | ICD-10-CM | POA: Diagnosis not present

## 2022-03-15 MED ORDER — ASPIRIN 81 MG PO TBEC: 81.0000 mg | DELAYED_RELEASE_TABLET | Freq: Every day | ORAL | 3 refills | Status: AC

## 2022-03-15 NOTE — Progress Notes (Signed)
03/15/2022 Phyllis Moore   16-Jun-1960  [id number]  Primary Physician Secundino Ginger, PA-C Primary Cardiologist: Lorretta Harp MD Phyllis Moore, [place]  HPI:  Phyllis Moore is a 61 y.o. moderately overweight single African-American female mother of 2 living children (1 deceased), grandmother twin granddaughter referred by Dr. Claudie Leach for coronary angiography because of an abnormal coronary CTA.  She does not work.  She smoked 40 pack years currently smoking one third of a pack per day.  She does have treated hypertension, diabetes and hyperlipidemia.  There is no family history of heart disease.  She never had heart attack or stroke.  She denies chest pain but does complain of dyspnea on exertion for the last 45 years.  She had a Myoview stress test performed/3/23 that showed no ischemia with normal EF.  A coronary CTA performed because of dyspnea 01/17/2022 revealed a coronary calcium score 62 with a physiologically significant lesion in the mid LAD.   Current Meds  Medication Sig   albuterol (VENTOLIN HFA) 108 (90 Base) MCG/ACT inhaler Inhale 1-2 puffs into the lungs every 6 (six) hours as needed for wheezing or shortness of breath.   ALPRAZolam (XANAX) 1 MG tablet Take 1 mg by mouth 3 (three) times daily as needed.    BOTOX 200 units SOLR Inject 200 Units into the skin every 3 (three) months.   buPROPion (WELLBUTRIN SR) 150 MG 12 hr tablet Take 150 mg by mouth 2 (two) times daily.   cetirizine (ZYRTEC) 10 MG tablet Take 10 mg by mouth daily.   cyclobenzaprine (FLEXERIL) 10 MG tablet Take 10 mg by mouth 3 (three) times daily as needed for muscle spasms.   diclofenac Sodium (VOLTAREN) 1 % GEL Apply topically 4 (four) times daily.   fluticasone (FLONASE) 50 MCG/ACT nasal spray Place 1-2 sprays into both nostrils daily.   furosemide (LASIX) 20 MG tablet Take 20 mg by mouth daily.   gabapentin (NEURONTIN) 300 MG capsule Take 300 mg by mouth 3 (three) times daily.    hydrochlorothiazide (HYDRODIURIL) 12.5 MG tablet Take 12.5 mg by mouth daily.   HYDROcodone-acetaminophen (NORCO/VICODIN) 5-325 MG tablet Take 1 tablet by mouth in the morning, at noon, and at bedtime.   ibuprofen (ADVIL) 800 MG tablet Take 800 mg by mouth 3 (three) times daily.   levothyroxine (SYNTHROID) 25 MCG tablet Take 25 mcg by mouth daily before breakfast.   lisinopril (PRINIVIL,ZESTRIL) 10 MG tablet Take 1 tablet (10 mg total) by mouth daily.   metFORMIN (GLUCOPHAGE-XR) 500 MG 24 hr tablet Take 500 mg by mouth in the morning and at bedtime.   metoprolol tartrate (LOPRESSOR) 50 MG tablet Take tablet TWO hours prior to your cardiac CT scan.   nystatin (MYCOSTATIN) 100000 UNIT/ML suspension Take 6 mLs by mouth 4 (four) times daily.   OZEMPIC, 0.25 OR 0.5 MG/DOSE, 2 MG/3ML SOPN Inject into the skin.   Pitavastatin Calcium (LIVALO) 4 MG TABS Take 4 mg by mouth daily.     No Known Allergies  Social History   Socioeconomic History   Marital status: Single    Spouse name: Not on file   Number of children: Not on file   Years of education: Not on file   Highest education level: Not on file  Occupational History   Not on file  Tobacco Use   Smoking status: Every Day    Packs/day: 0.50    Types: Cigarettes   Smokeless tobacco: Never  Vaping Use   Vaping  Use: Never used  Substance and Sexual Activity   Alcohol use: No    Alcohol/week: 0.0 standard drinks of alcohol   Drug use: No   Sexual activity: Never    Birth control/protection: Surgical  Other Topics Concern   Not on file  Social History Narrative   Lives alone   Right handed   Caffeine: 4 cups/day   Social Determinants of Health   Financial Resource Strain: Not on file  Food Insecurity: Not on file  Transportation Needs: Not on file  Physical Activity: Not on file  Stress: Not on file  Social Connections: Not on file  Intimate Partner Violence: Not on file     Review of Systems: General: negative for chills,  fever, night sweats or weight changes.  Cardiovascular: negative for chest pain, dyspnea on exertion, edema, orthopnea, palpitations, paroxysmal nocturnal dyspnea or shortness of breath Dermatological: negative for rash Respiratory: negative for cough or wheezing Urologic: negative for hematuria Abdominal: negative for nausea, vomiting, diarrhea, bright red blood per rectum, melena, or hematemesis Neurologic: negative for visual changes, syncope, or dizziness All other systems reviewed and are otherwise negative except as noted above.    Blood pressure (!) 140/88, pulse 88, weight 204 lb 6.4 oz (92.7 kg), SpO2 99 %.  General appearance: alert and no distress Neck: no adenopathy, no carotid bruit, no JVD, supple, symmetrical, trachea midline, and thyroid not enlarged, symmetric, no tenderness/mass/nodules Lungs: clear to auscultation bilaterally Heart: regular rate and rhythm, S1, S2 normal, no murmur, click, rub or gallop Extremities: extremities normal, atraumatic, no cyanosis or edema Pulses: 2+ and symmetric Skin: Skin color, texture, turgor normal. No rashes or lesions Neurologic: Grossly normal  EKG sinus rhythm at 88 with left axis deviation and low limb voltage.  Personally reviewed this EKG.  ASSESSMENT AND PLAN:   Coronary artery disease Ms. States was referred by Dr. Rosario for cardiac catheterization because of a recent coronary CTA performed 01/17/2022 revealed a coronary calcium score 62 and a physiologically significant lesion in the mid LAD.  She does have risk factors including ongoing tobacco abuse, diabetes, hypertension and hyperlipidemia.  She complains of dyspnea but denies chest pain.  She has a normal EF by 2D echo and a Myoview stress test performed by Dr. Rosario/3/23 that showed no ischemia.  I have reviewed the risks, indications, and alternatives to cardiac catheterization, possible angioplasty, and stenting with the patient. Risks include but are not limited  to bleeding, infection, vascular injury, stroke, myocardial infection, arrhythmia, kidney injury, radiation-related injury in the case of prolonged fluoroscopy use, emergency cardiac surgery, and death. The patient understands the risks of serious complication is 1-2 in 1000 with diagnostic cardiac cath and 1-2% or less with angioplasty/stenting.      Cathey Fredenburg J. Sabas Frett MD FACP,FACC,FAHA, FSCAI 03/15/2022 2:03 PM 

## 2022-03-15 NOTE — Assessment & Plan Note (Signed)
Ms. Crigler was referred by Dr. Claudie Leach for cardiac catheterization because of a recent coronary CTA performed 01/17/2022 revealed a coronary calcium score 62 and a physiologically significant lesion in the mid LAD.  She does have risk factors including ongoing tobacco abuse, diabetes, hypertension and hyperlipidemia.  She complains of dyspnea but denies chest pain.  She has a normal EF by 2D echo and a Myoview stress test performed by Dr. Sheral Apley that showed no ischemia. I have reviewed the risks, indications, and alternatives to cardiac catheterization, possible angioplasty, and stenting with the patient. Risks include but are not limited to bleeding, infection, vascular injury, stroke, myocardial infection, arrhythmia, kidney injury, radiation-related injury in the case of prolonged fluoroscopy use, emergency cardiac surgery, and death. The patient understands the risks of serious complication is 1-2 in 1103 with diagnostic cardiac cath and 1-2% or less with angioplasty/stenting.

## 2022-03-15 NOTE — H&P (View-Only) (Signed)
03/15/2022 Phyllis Moore   16-Jun-1960  [id number]  Primary Physician Secundino Ginger, PA-C Primary Cardiologist: Lorretta Harp MD Lupe Carney, [place]  HPI:  Phyllis Moore is a 61 y.o. moderately overweight single African-American female mother of 2 living children (1 deceased), grandmother twin granddaughter referred by Dr. Claudie Leach for coronary angiography because of an abnormal coronary CTA.  She does not work.  She smoked 40 pack years currently smoking one third of a pack per day.  She does have treated hypertension, diabetes and hyperlipidemia.  There is no family history of heart disease.  She never had heart attack or stroke.  She denies chest pain but does complain of dyspnea on exertion for the last 45 years.  She had a Myoview stress test performed/3/23 that showed no ischemia with normal EF.  A coronary CTA performed because of dyspnea 01/17/2022 revealed a coronary calcium score 62 with a physiologically significant lesion in the mid LAD.   Current Meds  Medication Sig   albuterol (VENTOLIN HFA) 108 (90 Base) MCG/ACT inhaler Inhale 1-2 puffs into the lungs every 6 (six) hours as needed for wheezing or shortness of breath.   ALPRAZolam (XANAX) 1 MG tablet Take 1 mg by mouth 3 (three) times daily as needed.    BOTOX 200 units SOLR Inject 200 Units into the skin every 3 (three) months.   buPROPion (WELLBUTRIN SR) 150 MG 12 hr tablet Take 150 mg by mouth 2 (two) times daily.   cetirizine (ZYRTEC) 10 MG tablet Take 10 mg by mouth daily.   cyclobenzaprine (FLEXERIL) 10 MG tablet Take 10 mg by mouth 3 (three) times daily as needed for muscle spasms.   diclofenac Sodium (VOLTAREN) 1 % GEL Apply topically 4 (four) times daily.   fluticasone (FLONASE) 50 MCG/ACT nasal spray Place 1-2 sprays into both nostrils daily.   furosemide (LASIX) 20 MG tablet Take 20 mg by mouth daily.   gabapentin (NEURONTIN) 300 MG capsule Take 300 mg by mouth 3 (three) times daily.    hydrochlorothiazide (HYDRODIURIL) 12.5 MG tablet Take 12.5 mg by mouth daily.   HYDROcodone-acetaminophen (NORCO/VICODIN) 5-325 MG tablet Take 1 tablet by mouth in the morning, at noon, and at bedtime.   ibuprofen (ADVIL) 800 MG tablet Take 800 mg by mouth 3 (three) times daily.   levothyroxine (SYNTHROID) 25 MCG tablet Take 25 mcg by mouth daily before breakfast.   lisinopril (PRINIVIL,ZESTRIL) 10 MG tablet Take 1 tablet (10 mg total) by mouth daily.   metFORMIN (GLUCOPHAGE-XR) 500 MG 24 hr tablet Take 500 mg by mouth in the morning and at bedtime.   metoprolol tartrate (LOPRESSOR) 50 MG tablet Take tablet TWO hours prior to your cardiac CT scan.   nystatin (MYCOSTATIN) 100000 UNIT/ML suspension Take 6 mLs by mouth 4 (four) times daily.   OZEMPIC, 0.25 OR 0.5 MG/DOSE, 2 MG/3ML SOPN Inject into the skin.   Pitavastatin Calcium (LIVALO) 4 MG TABS Take 4 mg by mouth daily.     No Known Allergies  Social History   Socioeconomic History   Marital status: Single    Spouse name: Not on file   Number of children: Not on file   Years of education: Not on file   Highest education level: Not on file  Occupational History   Not on file  Tobacco Use   Smoking status: Every Day    Packs/day: 0.50    Types: Cigarettes   Smokeless tobacco: Never  Vaping Use   Vaping  Use: Never used  Substance and Sexual Activity   Alcohol use: No    Alcohol/week: 0.0 standard drinks of alcohol   Drug use: No   Sexual activity: Never    Birth control/protection: Surgical  Other Topics Concern   Not on file  Social History Narrative   Lives alone   Right handed   Caffeine: 4 cups/day   Social Determinants of Health   Financial Resource Strain: Not on file  Food Insecurity: Not on file  Transportation Needs: Not on file  Physical Activity: Not on file  Stress: Not on file  Social Connections: Not on file  Intimate Partner Violence: Not on file     Review of Systems: General: negative for chills,  fever, night sweats or weight changes.  Cardiovascular: negative for chest pain, dyspnea on exertion, edema, orthopnea, palpitations, paroxysmal nocturnal dyspnea or shortness of breath Dermatological: negative for rash Respiratory: negative for cough or wheezing Urologic: negative for hematuria Abdominal: negative for nausea, vomiting, diarrhea, bright red blood per rectum, melena, or hematemesis Neurologic: negative for visual changes, syncope, or dizziness All other systems reviewed and are otherwise negative except as noted above.    Blood pressure (!) 140/88, pulse 88, weight 204 lb 6.4 oz (92.7 kg), SpO2 99 %.  General appearance: alert and no distress Neck: no adenopathy, no carotid bruit, no JVD, supple, symmetrical, trachea midline, and thyroid not enlarged, symmetric, no tenderness/mass/nodules Lungs: clear to auscultation bilaterally Heart: regular rate and rhythm, S1, S2 normal, no murmur, click, rub or gallop Extremities: extremities normal, atraumatic, no cyanosis or edema Pulses: 2+ and symmetric Skin: Skin color, texture, turgor normal. No rashes or lesions Neurologic: Grossly normal  EKG sinus rhythm at 88 with left axis deviation and low limb voltage.  Personally reviewed this EKG.  ASSESSMENT AND PLAN:   Coronary artery disease Phyllis Moore was referred by Dr. Claudie Leach for cardiac catheterization because of a recent coronary CTA performed 01/17/2022 revealed a coronary calcium score 62 and a physiologically significant lesion in the mid LAD.  She does have risk factors including ongoing tobacco abuse, diabetes, hypertension and hyperlipidemia.  She complains of dyspnea but denies chest pain.  She has a normal EF by 2D echo and a Myoview stress test performed by Dr. Sheral Apley that showed no ischemia.  I have reviewed the risks, indications, and alternatives to cardiac catheterization, possible angioplasty, and stenting with the patient. Risks include but are not limited  to bleeding, infection, vascular injury, stroke, myocardial infection, arrhythmia, kidney injury, radiation-related injury in the case of prolonged fluoroscopy use, emergency cardiac surgery, and death. The patient understands the risks of serious complication is 1-2 in [id number] with diagnostic cardiac cath and 1-2% or less with angioplasty/stenting.      Lorretta Harp MD FACP,FACC,FAHA, [facility] 03/15/2022 2:03 PM

## 2022-03-15 NOTE — Patient Instructions (Addendum)
     Cardiac Catheterization   You are scheduled for a Cardiac Catheterization on Monday, October 16 with Dr. Quay Burow.  1. Please arrive at the Main Entrance A at [facility]: [place], [place] [address] on October 16 at 5:30 AM (This time is two hours before your procedure to ensure your preparation). Free valet parking service is available. You will check in at ADMITTING. The support person will be asked to wait in the waiting room.  It is OK to have someone drop you off and come back when you are ready to be discharged.        Special note: Every effort is made to have your procedure done on time. Please understand that emergencies sometimes delay scheduled procedures.   . 2. Diet: Do not eat solid foods after midnight.  You may have clear liquids until 5 AM the day of the procedure.  3. Labs: Today in office (BMET, CBC)  4. Medication instructions in preparation for your procedure:   Contrast Allergy: No  Hold Lasix AM of procedure Hold HCTZ AM of procedure  Do not take Diabetes Med Glucophage (Metformin) on the day of the procedure and HOLD 48 HOURS AFTER THE PROCEDURE.  On the morning of your procedure, take Aspirin 81 mg and any morning medicines NOT listed above.  You may use sips of water.  5. Plan to go home the same day, you will only stay overnight if medically necessary. 6. You MUST have a responsible adult to drive you home. 7. An adult MUST be with you the first 24 hours after you arrive home. 8. Bring a current list of your medications, and the last time and date medication taken. 9. Bring ID and current insurance cards. 10.Please wear clothes that are easy to get on and off and wear slip-on shoes.  Thank you for allowing [place] to care for you!   -- [facility]    START aspirin 81 mg daily  Follow up in 1-2 weeks after cath with Dr. Gwenlyn Found

## 2022-03-16 ENCOUNTER — Other Ambulatory Visit: Payer: Self-pay

## 2022-03-16 DIAGNOSIS — R931 Abnormal findings on diagnostic imaging of heart and coronary circulation: Secondary | ICD-10-CM

## 2022-03-16 LAB — CBC
Hematocrit: 44.4 % (ref 34.0–46.6)
Hemoglobin: 14.8 g/dL (ref 11.1–15.9)
MCH: 30.9 pg (ref 26.6–33.0)
MCHC: 33.3 g/dL (ref 31.5–35.7)
MCV: 93 fL (ref 79–97)
Platelets: 200 10*3/uL (ref 150–450)
RBC: 4.79 x10E6/uL (ref 3.77–5.28)
RDW: 12.9 % (ref 11.7–15.4)
WBC: 5.5 10*3/uL (ref 3.4–10.8)

## 2022-03-16 LAB — BASIC METABOLIC PANEL
BUN/Creatinine Ratio: 9 (ref 9–23)
BUN: 7 mg/dL (ref 6–24)
CO2: 24 mmol/L (ref 20–29)
Calcium: 9.9 mg/dL (ref 8.7–10.2)
Chloride: 103 mmol/L (ref 96–106)
Creatinine, Ser: 0.8 mg/dL (ref 0.57–1.00)
Glucose: 87 mg/dL (ref 70–99)
Potassium: 4.1 mmol/L (ref 3.5–5.2)
Sodium: 143 mmol/L (ref 134–144)
eGFR: 85 mL/min/{1.73_m2} (ref >59)

## 2022-03-16 MED ORDER — SODIUM CHLORIDE 0.9% FLUSH: 3.0000 mL | Freq: Two times a day (BID) | INTRAVENOUS | Status: AC

## 2022-03-17 ENCOUNTER — Telehealth: Payer: Self-pay | Admitting: *Deleted

## 2022-03-17 NOTE — Telephone Encounter (Signed)
Call placed to patient to review procedure instructions, no answer. ?

## 2022-03-17 NOTE — Telephone Encounter (Signed)
Reviewed procedure instructions with patient's daughter (DPR), Molisha.

## 2022-03-17 NOTE — Telephone Encounter (Addendum)
Cardiac Catheterization scheduled at [facility] for: Monday March 21, 2022 7:30 AM Arrival time and place: [place] Entrance A at: 5:30 AM  Nothing to eat after midnight prior to procedure, clear liquids until 5 AM day of procedure.  Medication instructions: -Hold:  Metformin-day of procedure and 48 hours post procedure  Lasix/HCTZ-AM of procedure Ozempic-weekly-daughter thinks weekly on Sundays-will hold until post procedure. -Except hold medications usual morning medications can be taken with sips of water including aspirin 81 mg.  Confirmed patient has responsible adult to drive home post procedure and be with patient first 24 hours after arriving home.  Patient reports no new symptoms concerning for COVID-19 in the past 10 days.  Call placed to patient to review instructions, no answer.

## 2022-03-21 ENCOUNTER — Ambulatory Visit (HOSPITAL_COMMUNITY)
Admission: RE | Admit: 2022-03-21 | Discharge: 2022-03-21 | Disposition: A | Discharge status: Home / Self Care | Payer: Medicaid Other | Attending: Cardiovascular Disease | Admitting: Cardiovascular Disease

## 2022-03-21 ENCOUNTER — Encounter (HOSPITAL_COMMUNITY)
Admission: RE | Disposition: A | Discharge status: Home / Self Care | Payer: Self-pay | Source: Home / Self Care | Attending: Cardiovascular Disease

## 2022-03-21 ENCOUNTER — Other Ambulatory Visit: Payer: Self-pay

## 2022-03-21 ENCOUNTER — Encounter (HOSPITAL_COMMUNITY): Payer: Self-pay | Admitting: Cardiovascular Disease

## 2022-03-21 DIAGNOSIS — E785 Hyperlipidemia, unspecified: Secondary | ICD-10-CM | POA: Insufficient documentation

## 2022-03-21 DIAGNOSIS — F17210 Nicotine dependence, cigarettes, uncomplicated: Secondary | ICD-10-CM | POA: Diagnosis not present

## 2022-03-21 DIAGNOSIS — Z7984 Long term (current) use of oral hypoglycemic drugs: Secondary | ICD-10-CM | POA: Diagnosis not present

## 2022-03-21 DIAGNOSIS — I10 Essential (primary) hypertension: Secondary | ICD-10-CM | POA: Diagnosis not present

## 2022-03-21 DIAGNOSIS — E119 Type 2 diabetes mellitus without complications: Secondary | ICD-10-CM | POA: Diagnosis not present

## 2022-03-21 DIAGNOSIS — R0609 Other forms of dyspnea: Secondary | ICD-10-CM | POA: Insufficient documentation

## 2022-03-21 DIAGNOSIS — R931 Abnormal findings on diagnostic imaging of heart and coronary circulation: Secondary | ICD-10-CM

## 2022-03-21 DIAGNOSIS — Z7985 Long-term (current) use of injectable non-insulin antidiabetic drugs: Secondary | ICD-10-CM | POA: Insufficient documentation

## 2022-03-21 DIAGNOSIS — I2510 Atherosclerotic heart disease of native coronary artery without angina pectoris: Secondary | ICD-10-CM

## 2022-03-21 HISTORY — PX: LEFT HEART CATH AND CORONARY ANGIOGRAPHY: CATH118249

## 2022-03-21 LAB — GLUCOSE, CAPILLARY
Glucose-Capillary: 175 mg/dL — ABNORMAL HIGH (ref 70–99)
Glucose-Capillary: 73 mg/dL (ref 70–99)

## 2022-03-21 SURGERY — LEFT HEART CATH AND CORONARY ANGIOGRAPHY
Anesthesia: LOCAL

## 2022-03-21 MED ORDER — HEPARIN (PORCINE) IN NACL 1000-0.9 UT/500ML-% IV SOLN
INTRAVENOUS | Status: AC
  Filled 2022-03-21: qty 500

## 2022-03-21 MED ORDER — HEPARIN SODIUM (PORCINE) 1000 UNIT/ML IJ SOLN
INTRAMUSCULAR | Status: AC
  Filled 2022-03-21: qty 10

## 2022-03-21 MED ORDER — HEPARIN (PORCINE) IN NACL 1000-0.9 UT/500ML-% IV SOLN
INTRAVENOUS | Status: DC | PRN
  Administered 2022-03-21 (×2): 500 mL

## 2022-03-21 MED ORDER — LIDOCAINE HCL (PF) 1 % IJ SOLN
INTRAMUSCULAR | Status: DC | PRN
  Administered 2022-03-21: 5 mL

## 2022-03-21 MED ORDER — NITROGLYCERIN 1 MG/10 ML FOR IR/CATH LAB
INTRA_ARTERIAL | Status: AC
  Filled 2022-03-21: qty 10

## 2022-03-21 MED ORDER — SODIUM CHLORIDE 0.9 % IV SOLN: 250.0000 mL | INTRAVENOUS | Status: DC | PRN

## 2022-03-21 MED ORDER — VERAPAMIL HCL 2.5 MG/ML IV SOLN
INTRAVENOUS | Status: AC
  Filled 2022-03-21: qty 2

## 2022-03-21 MED ORDER — VERAPAMIL HCL 2.5 MG/ML IV SOLN
INTRA_ARTERIAL | Status: DC | PRN
  Administered 2022-03-21: 5 mL via INTRA_ARTERIAL

## 2022-03-21 MED ORDER — SODIUM CHLORIDE 0.9 % WEIGHT BASED INFUSION: 1.0000 mL/kg/h | INTRAVENOUS | Status: DC

## 2022-03-21 MED ORDER — LIDOCAINE HCL (PF) 1 % IJ SOLN
INTRAMUSCULAR | Status: AC
  Filled 2022-03-21: qty 30

## 2022-03-21 MED ORDER — SODIUM CHLORIDE 0.9 % WEIGHT BASED INFUSION
3.0000 mL/kg/h | INTRAVENOUS | Status: AC
  Administered 2022-03-21: 3 mL/kg/h via INTRAVENOUS

## 2022-03-21 MED ORDER — SODIUM CHLORIDE 0.9% FLUSH: 3.0000 mL | INTRAVENOUS | Status: DC | PRN

## 2022-03-21 MED ORDER — ASPIRIN 81 MG PO CHEW: 81.0000 mg | CHEWABLE_TABLET | ORAL | Status: DC

## 2022-03-21 MED ORDER — SODIUM CHLORIDE 0.9 % IV SOLN: INTRAVENOUS | Status: DC

## 2022-03-21 MED ORDER — ASPIRIN 81 MG PO CHEW: 81.0000 mg | CHEWABLE_TABLET | Freq: Every day | ORAL | Status: DC

## 2022-03-21 MED ORDER — HYDRALAZINE HCL 20 MG/ML IJ SOLN: 10.0000 mg | INTRAMUSCULAR | Status: DC | PRN

## 2022-03-21 MED ORDER — MORPHINE SULFATE (PF) 2 MG/ML IV SOLN: 2.0000 mg | INTRAVENOUS | Status: DC | PRN

## 2022-03-21 MED ORDER — MIDAZOLAM HCL 2 MG/2ML IJ SOLN
INTRAMUSCULAR | Status: AC
  Filled 2022-03-21: qty 2

## 2022-03-21 MED ORDER — FENTANYL CITRATE (PF) 100 MCG/2ML IJ SOLN
INTRAMUSCULAR | Status: AC
  Filled 2022-03-21: qty 2

## 2022-03-21 MED ORDER — SODIUM CHLORIDE 0.9% FLUSH: 3.0000 mL | Freq: Two times a day (BID) | INTRAVENOUS | Status: DC

## 2022-03-21 MED ORDER — ONDANSETRON HCL 4 MG/2ML IJ SOLN: 4.0000 mg | Freq: Four times a day (QID) | INTRAMUSCULAR | Status: DC | PRN

## 2022-03-21 MED ORDER — MIDAZOLAM HCL 2 MG/2ML IJ SOLN
INTRAMUSCULAR | Status: DC | PRN
  Administered 2022-03-21: 1 mg via INTRAVENOUS

## 2022-03-21 MED ORDER — HEPARIN SODIUM (PORCINE) 1000 UNIT/ML IJ SOLN
INTRAMUSCULAR | Status: DC | PRN
  Administered 2022-03-21: 4500 [IU] via INTRAVENOUS

## 2022-03-21 MED ORDER — LABETALOL HCL 5 MG/ML IV SOLN: 10.0000 mg | INTRAVENOUS | Status: DC | PRN

## 2022-03-21 MED ORDER — FENTANYL CITRATE (PF) 100 MCG/2ML IJ SOLN
INTRAMUSCULAR | Status: DC | PRN
  Administered 2022-03-21: 25 ug via INTRAVENOUS

## 2022-03-21 MED ORDER — IOHEXOL 350 MG/ML SOLN
INTRAVENOUS | Status: DC | PRN
  Administered 2022-03-21: 55 mL

## 2022-03-21 MED ORDER — ATORVASTATIN CALCIUM 80 MG PO TABS: 80.0000 mg | ORAL_TABLET | Freq: Every day | ORAL | Status: DC

## 2022-03-21 MED ORDER — ACETAMINOPHEN 325 MG PO TABS
650.0000 mg | ORAL_TABLET | ORAL | Status: DC | PRN
  Administered 2022-03-21: 650 mg via ORAL
  Filled 2022-03-21: qty 2

## 2022-03-21 SURGICAL SUPPLY — 11 items
BAND ZEPHYR COMPRESS 30 LONG (HEMOSTASIS) IMPLANT
CATH INFINITI JR4 5F (CATHETERS) IMPLANT
CATH OPTITORQUE TIG 4.0 5F (CATHETERS) IMPLANT
GLIDESHEATH SLEND A-KIT 6F 22G (SHEATH) IMPLANT
GUIDEWIRE INQWIRE 1.5J.035X260 (WIRE) IMPLANT
INQWIRE 1.5J .035X260CM (WIRE) ×1
KIT HEART LEFT (KITS) ×1 IMPLANT
PACK CARDIAC CATHETERIZATION (CUSTOM PROCEDURE TRAY) ×1 IMPLANT
TRANSDUCER W/STOPCOCK (MISCELLANEOUS) ×1 IMPLANT
TUBING CIL FLEX 10 FLL-RA (TUBING) ×1 IMPLANT
WIRE HI TORQ VERSACORE-J 145CM (WIRE) IMPLANT

## 2022-03-21 NOTE — Discharge Instructions (Signed)

## 2022-03-21 NOTE — Interval H&P Note (Signed)
Cath Lab Visit (complete for each Cath Lab visit)  Clinical Evaluation Leading to the Procedure:   ACS: No.  Non-ACS:    Anginal Classification: CCS II  Anti-ischemic medical therapy: No Therapy  Non-Invasive Test Results: Low-risk stress test findings: cardiac mortality <1%/year  Prior CABG: No previous CABG      History and Physical Interval Note:  03/21/2022 7:36 AM  Phyllis Moore  has presented today for surgery, with the diagnosis of abnormal ct.  The various methods of treatment have been discussed with the patient and family. After consideration of risks, benefits and other options for treatment, the patient has consented to  Procedure(s): LEFT HEART CATH AND CORONARY ANGIOGRAPHY (N/A) as a surgical intervention.  The patient's history has been reviewed, patient examined, no change in status, stable for surgery.  I have reviewed the patient's chart and labs.  Questions were answered to the patient's satisfaction.     Quay Burow

## 2022-04-06 ENCOUNTER — Telehealth: Payer: Self-pay

## 2022-04-06 NOTE — Telephone Encounter (Signed)
Left message for pt to call back regarding office visit with Dr. Gwenlyn Found after heart cath.

## 2022-04-07 ENCOUNTER — Encounter: Payer: Self-pay | Admitting: Family Medicine

## 2022-04-07 ENCOUNTER — Telehealth: Payer: Self-pay | Admitting: Family Medicine

## 2022-04-07 NOTE — Telephone Encounter (Signed)
LVM and sent letter advising pt of appt change. Rescheduled 12/28 Botox to 06/09/22 at 8:30 am due to Amy being out of the office.

## 2022-04-08 NOTE — Telephone Encounter (Signed)
Left message for pt to call back for office visit with Dr. Gwenlyn Found post heart cath.

## 2022-05-17 ENCOUNTER — Ambulatory Visit: Payer: Medicaid Other | Admitting: Cardiovascular Disease

## 2022-05-25 ENCOUNTER — Telehealth: Payer: Self-pay | Admitting: Family Medicine

## 2022-05-25 NOTE — Telephone Encounter (Signed)
[facility] states Botox is to be delivered on 05/26/22.

## 2022-06-02 ENCOUNTER — Ambulatory Visit: Payer: Medicaid Other | Admitting: Family Medicine

## 2022-06-08 ENCOUNTER — Ambulatory Visit: Payer: Medicaid Other | Admitting: Family Medicine

## 2022-06-08 NOTE — Progress Notes (Deleted)
06/08/22 ALL: Phyllis Moore returns for Botox.   03/09/2022 ALL: Phyllis Moore returns for Botox. She continues to do well. She has 2-3 migraine days per month. Aborted with ibuprofen.   12/14/2021 ALL: Phyllis Moore returns for Botox. She is doing well. She has about 4 migraines per month. Botox helps significantly.   09/14/2021 ALL: Phyllis Moore returns for Botox. She is doing well. May have 4-5 milder migrainous headaches per month. She is able to abort headache with ibuprofen. She is doing well and without concerns.   06/14/2021 ALL: Phyllis Moore returns for Botox. This is her 4th procedure. She is doing well. She has about 5 headaches per month. Ibuprofen works well for abortive therapy. She continues to work with psychology.   03/08/2021 ALL: She continues to do well with Botox procedures. Baseline daily headaches with > 15 migraine days every month. Now, having 8-10 headaches a month. She feels that they get worse towards the last two weeks before next Botox procedure. She is working with PCP to treat insomnia. She is in with psychology following the death of her daughter.   2020-12-24 ALL: This is her second Botox procedure. She does feel that Botox has helped decrease intensity of headaches. She has about 8-12 migraines per month that are usually aborted with Aleve or Ibuprofen and rest. She denies adverse effects from last Botox procedure.    Consent Form Botulism Toxin Injection For Chronic Migraine    Reviewed orally with patient, additionally signature is on file:  Botulism toxin has been approved by the Federal drug administration for treatment of chronic migraine. Botulism toxin does not cure chronic migraine and it may not be effective in some patients.  The administration of botulism toxin is accomplished by injecting a small amount of toxin into the muscles of the neck and head. Dosage must be titrated for each individual. Any benefits resulting from botulism toxin tend to wear off after 3 months with a  repeat injection required if benefit is to be maintained. Injections are usually done every 3-4 months with maximum effect peak achieved by about 2 or 3 weeks. Botulism toxin is expensive and you should be sure of what costs you will incur resulting from the injection.  The side effects of botulism toxin use for chronic migraine may include:   -Transient, and usually mild, facial weakness with facial injections  -Transient, and usually mild, head or neck weakness with head/neck injections  -Reduction or loss of forehead facial animation due to forehead muscle weakness  -Eyelid drooping  -Dry eye  -Pain at the site of injection or bruising at the site of injection  -Double vision  -Potential unknown long term risks   Contraindications: You should not have Botox if you are pregnant, nursing, allergic to albumin, have an infection, skin condition, or muscle weakness at the site of the injection, or have myasthenia gravis, Lambert-Eaton syndrome, or ALS.  It is also possible that as with any injection, there may be an allergic reaction or no effect from the medication. Reduced effectiveness after repeated injections is sometimes seen and rarely infection at the injection site may occur. All care will be taken to prevent these side effects. If therapy is given over a long time, atrophy and wasting in the muscle injected may occur. Occasionally the patient's become refractory to treatment because they develop antibodies to the toxin. In this event, therapy needs to be modified.  I have read the above information and consent to the administration of botulism toxin.  BOTOX PROCEDURE NOTE FOR MIGRAINE HEADACHE  Contraindications and precautions discussed with patient(above). Aseptic procedure was observed and patient tolerated procedure. Procedure performed by Debbora Presto, FNP-C.   The condition has existed for more than 6 months, and pt does not have a diagnosis of ALS, Myasthenia Gravis or  Lambert-Eaton Syndrome.  Risks and benefits of injections discussed and pt agrees to proceed with the procedure.  Written consent obtained  These injections are medically necessary. Pt  receives good benefits from these injections. These injections do not cause sedations or hallucinations which the oral therapies may cause.   Description of procedure:  The patient was placed in a sitting position. The standard protocol was used for Botox as follows, with 5 units of Botox injected at each site:  -Procerus muscle, midline injection  -Corrugator muscle, bilateral injection  -Frontalis muscle, bilateral injection, with 2 sites each side, medial injection was performed in the upper one third of the frontalis muscle, in the region vertical from the medial inferior edge of the superior orbital rim. The lateral injection was again in the upper one third of the forehead vertically above the lateral limbus of the cornea, 1.5 cm lateral to the medial injection site.  -Temporalis muscle injection, 4 sites, bilaterally. The first injection was 3 cm above the tragus of the ear, second injection site was 1.5 cm to 3 cm up from the first injection site in line with the tragus of the ear. The third injection site was 1.5-3 cm forward between the first 2 injection sites. The fourth injection site was 1.5 cm posterior to the second injection site. 5th site laterally in the temporalis  muscleat the level of the outer canthus.  -Occipitalis muscle injection, 3 sites, bilaterally. The first injection was done one half way between the occipital protuberance and the tip of the mastoid process behind the ear. The second injection site was done lateral and superior to the first, 1 fingerbreadth from the first injection. The third injection site was 1 fingerbreadth superiorly and medially from the first injection site.  -Cervical paraspinal muscle injection, 2 sites, bilaterally. The first injection site was 1 cm from the  midline of the cervical spine, 3 cm inferior to the lower border of the occipital protuberance. The second injection site was 1.5 cm superiorly and laterally to the first injection site.  -Trapezius muscle injection was performed at 3 sites, bilaterally. The first injection site was in the upper trapezius muscle halfway between the inflection point of the neck, and the acromion. The second injection site was one half way between the acromion and the first injection site. The third injection was done between the first injection site and the inflection point of the neck.   Will return for repeat injection in 3 months.   A total of 200 units of Botox was prepared, 155 units of Botox was injected as documented above, any Botox not injected was wasted. The patient tolerated the procedure well, there were no complications of the above procedure.

## 2022-06-09 ENCOUNTER — Ambulatory Visit: Payer: Medicaid Other | Admitting: Family Medicine

## 2022-06-09 DIAGNOSIS — G43711 Chronic migraine without aura, intractable, with status migrainosus: Secondary | ICD-10-CM

## 2022-06-22 ENCOUNTER — Telehealth: Payer: Self-pay | Admitting: Family Medicine

## 2022-06-22 NOTE — Telephone Encounter (Signed)
LVM and sent text msg informing pt of need to reschedule 06/30/22 appointment - NP out

## 2022-06-30 ENCOUNTER — Ambulatory Visit: Payer: Medicaid Other | Admitting: Family Medicine

## 2022-07-13 ENCOUNTER — Ambulatory Visit: Payer: Medicaid Other | Admitting: Cardiovascular Disease

## 2022-08-05 ENCOUNTER — Telehealth: Payer: Self-pay | Admitting: Family Medicine

## 2022-08-05 NOTE — Telephone Encounter (Signed)
Specialty By Wyvonnia Dusky Ishmael Holter) pt's Botox is about expire, need renewal. Would like a call back to verify.

## 2022-08-08 NOTE — Telephone Encounter (Signed)
Called pt and left a VM message to please call office.

## 2022-08-08 NOTE — Telephone Encounter (Signed)
I do not see where pt is scheduled for a botox appt. Can you please call pt and verify they still want to do botox treatments? If so, please schedule.  We can then send prior auth request to pharmacy team to work on [place]. Please make sure we have pt updated insurance info in epic also.

## 2022-08-09 NOTE — Telephone Encounter (Signed)
Can you try calling pt again today?

## 2022-08-10 NOTE — Telephone Encounter (Signed)
Called pt again and left her a voicemail message to please call office. Per DPR called pt daughter and asked if she could ask pt to please call the office. Stated pt is living with  her and when she get home she will have pt to call the office.

## 2022-08-16 ENCOUNTER — Telehealth: Payer: Self-pay | Admitting: Neurology

## 2022-08-16 NOTE — Telephone Encounter (Signed)
New prior auth needed before this appt.

## 2022-08-16 NOTE — Telephone Encounter (Signed)
Schedule patient a Botox appt on 10/04/22 at 3:30p

## 2022-08-17 ENCOUNTER — Other Ambulatory Visit (HOSPITAL_COMMUNITY): Payer: Self-pay

## 2022-08-17 NOTE — Telephone Encounter (Signed)
Pharmacy Patient Advocate Encounter- Botox BIV-Pharmacy Benefit:  PA was submitted to [facility] and has been approved through: 08/17/2023 Authorization# PA Case ID: [id number]  Please send prescription to Specialty Pharmacy: [facility]: [phone]  Estimated Copay is: $4.00  Patient Is Not eligible for Botox Copay Card, which will make patient's copay as little as zero. Copay card will be provided to pharmacy.

## 2022-08-30 NOTE — Progress Notes (Deleted)
Cardiology Clinic Note   Patient Name: Phyllis Moore Date of Encounter: 08/30/2022  Primary Care Provider:  Secundino Ginger, PA-C Primary Cardiologist:  Dr. Gwenlyn Found   Patient Profile    62 year old female with history of elevated coronary calcium score of 62, revealing of significant lesion in the mid LAD with ongoing tobacco abuse, diabetes, hypertension and hyperlipidemia, and hypothyroidism.  She was scheduled for cardiac catheterization with Dr. Quay Burow on 03/21/2022.  This revealed mid LAD lesion 80% stenosis, mid to distal LAD lesion 50% stenosed.  She was recommended for medical therapy and to follow-up with primary cardiologist Dr. Claudie Leach.   Past Medical History    Past Medical History:  Diagnosis Date   Anxiety    Diabetes mellitus, type II ([place])    per [facility]   Hyperlipidemia    per [facility] notes   Hypertension    Thyroid disease    Past Surgical History:  Procedure Laterality Date   ABDOMINAL HYSTERECTOMY     CARPAL TUNNEL RELEASE Bilateral    LEFT HEART CATH AND CORONARY ANGIOGRAPHY N/A 03/21/2022   Procedure: LEFT HEART CATH AND CORONARY ANGIOGRAPHY;  Surgeon: Lorretta Harp, MD;  Location: [facility];  Service: Cardiovascular;  Laterality: N/A;   ROTATOR CUFF REPAIR Right     Allergies  No Known Allergies  History of Present Illness    Mrs. Phyllis Moore comes today for ongoing assessment and management of coronary artery disease, cardiac catheterization October 2023 for Dr. Quay Burow as described above, hypertension, and hyperlipidemia.  She is being treated medically  Home Medications    Current Outpatient Medications  Medication Sig Dispense Refill   albuterol (VENTOLIN HFA) 108 (90 Base) MCG/ACT inhaler Inhale 1-2 puffs into the lungs every 6 (six) hours as needed for wheezing or shortness of breath.     ALPRAZolam (XANAX) 1 MG tablet Take 1 mg by mouth 3 (three) times daily as needed for anxiety.      aspirin EC 81 MG tablet Take 1 tablet (81 mg total) by mouth daily. Swallow whole. 90 tablet 3   atorvastatin (LIPITOR) 80 MG tablet Take 80 mg by mouth daily.     BOTOX 200 units SOLR Inject 200 Units into the skin every 3 (three) months. 1 each 3   buPROPion (WELLBUTRIN SR) 150 MG 12 hr tablet Take 150 mg by mouth 2 (two) times daily.     cetirizine (ZYRTEC) 10 MG tablet Take 10 mg by mouth daily.     cholecalciferol (VITAMIN D3) 25 MCG (1000 UNIT) tablet Take 1,000 Units by mouth daily.     COLLAGEN PO Take 2 capsules by mouth daily.     cyclobenzaprine (FLEXERIL) 10 MG tablet Take 10 mg by mouth 3 (three) times daily as needed for muscle spasms.     diclofenac Sodium (VOLTAREN) 1 % GEL Apply 1 Application topically 4 (four) times daily as needed (joint pain).     FLUoxetine (PROZAC) 20 MG capsule Take 20 mg by mouth daily.     fluticasone (FLONASE) 50 MCG/ACT nasal spray Place 1-2 sprays into both nostrils daily as needed for allergies.     furosemide (LASIX) 20 MG tablet Take 20 mg by mouth daily.     gabapentin (NEURONTIN) 300 MG capsule Take 300 mg by mouth at bedtime as needed (feet pain).     hydrochlorothiazide (HYDRODIURIL) 12.5 MG tablet Take 12.5 mg by mouth daily.     HYDROcodone-acetaminophen (NORCO) 7.5-325 MG  tablet Take 1 tablet by mouth in the morning, at noon, and at bedtime.     ibuprofen (ADVIL) 800 MG tablet Take 800 mg by mouth every 8 (eight) hours as needed for moderate pain.     levothyroxine (SYNTHROID) 25 MCG tablet Take 25 mcg by mouth daily before breakfast.     lisinopril (PRINIVIL,ZESTRIL) 10 MG tablet Take 1 tablet (10 mg total) by mouth daily.     metFORMIN (GLUCOPHAGE-XR) 500 MG 24 hr tablet Take 500 mg by mouth in the morning and at bedtime.     nystatin (MYCOSTATIN) 100000 UNIT/ML suspension Take 6 mLs by mouth 4 (four) times daily.     OZEMPIC, 0.25 OR 0.5 MG/DOSE, 2 MG/3ML SOPN Inject 0.25 mg into the skin once a week.     vitamin E 180 MG (400 UNITS)  capsule Take 400 Units by mouth daily.     zolpidem (AMBIEN) 5 MG tablet Take 5 mg by mouth at bedtime.     Current Facility-Administered Medications  Medication Dose Route Frequency Provider Last Rate Last Admin   sodium chloride flush (NS) 0.9 % injection 3 mL  3 mL Intravenous Q12H Lorretta Harp, MD         Family History    Family History  Problem Relation Age of Onset   Stroke Mother    Cancer Mother    Migraines Sister    Migraines Niece    She indicated that the status of her mother is unknown. She indicated that the status of her sister is unknown. She indicated that the status of her niece is unknown.  Social History    Social History   Socioeconomic History   Marital status: Single    Spouse name: Not on file   Number of children: Not on file   Years of education: Not on file   Highest education level: Not on file  Occupational History   Not on file  Tobacco Use   Smoking status: Every Day    Packs/day: .5    Types: Cigarettes   Smokeless tobacco: Never  Vaping Use   Vaping Use: Never used  Substance and Sexual Activity   Alcohol use: No    Alcohol/week: 0.0 standard drinks of alcohol   Drug use: No   Sexual activity: Never    Birth control/protection: Surgical  Other Topics Concern   Not on file  Social History Narrative   Lives alone   Right handed   Caffeine: 4 cups/day   Social Determinants of Health   Financial Resource Strain: Not on file  Food Insecurity: Not on file  Transportation Needs: Not on file  Physical Activity: Not on file  Stress: Not on file  Social Connections: Not on file  Intimate Partner Violence: Not on file     Review of Systems    General:  No chills, fever, night sweats or weight changes.  Cardiovascular:  No chest pain, dyspnea on exertion, edema, orthopnea, palpitations, paroxysmal nocturnal dyspnea. Dermatological: No rash, lesions/masses Respiratory: No cough, dyspnea Urologic: No hematuria,  dysuria Abdominal:   No nausea, vomiting, diarrhea, bright red blood per rectum, melena, or hematemesis Neurologic:  No visual changes, wkns, changes in mental status. All other systems reviewed and are otherwise negative except as noted above.     Physical Exam    VS:  There were no vitals taken for this visit. , BMI There is no height or weight on file to calculate BMI.     GEN:  Well nourished, well developed, in no acute distress. HEENT: normal. Neck: Supple, no JVD, carotid bruits, or masses. Cardiac: RRR, no murmurs, rubs, or gallops. No clubbing, cyanosis, edema.  Radials/DP/PT 2+ and equal bilaterally.  Respiratory:  Respirations regular and unlabored, clear to auscultation bilaterally. GI: Soft, nontender, nondistended, BS + x 4. MS: no deformity or atrophy. Skin: warm and dry, no rash. Neuro:  Strength and sensation are intact. Psych: Normal affect.  Accessory Clinical Findings    ECG personally reviewed by me today- *** - No acute changes  Lab Results  Component Value Date   WBC 5.5 03/15/2022   HGB 14.8 03/15/2022   HCT 44.4 03/15/2022   MCV 93 03/15/2022   PLT 200 03/15/2022   Lab Results  Component Value Date   CREATININE 0.80 03/15/2022   BUN 7 03/15/2022   NA 143 03/15/2022   K 4.1 03/15/2022   CL 103 03/15/2022   CO2 24 03/15/2022   Lab Results  Component Value Date   ALT 27 08/03/2020   AST 17 08/03/2020   ALKPHOS 89 08/03/2020   BILITOT <0.2 08/03/2020   No results found for: "CHOL", "HDL", "LDLCALC", "LDLDIRECT", "TRIG", "CHOLHDL"  No results found for: "HGBA1C"  Review of Prior Studies: Coronary CTA 01/14/2022 1. Left Main: Normal   2. LAD: 0.96 to 0.74 post lesion, 0.64 distal   3. LCX: Normal   4. Ramus: N/a   5. RCA: Normal   IMPRESSION: 1. Abnormal FFR. LAD demonstrates significant flow abnormality. Proximal 0.96, mid 0.74 post lesion.   2. Recommend cardiac catheterization as well as goal directed medical therapy.  [facility]  03/21/2022 Left Anterior Descending  Mid LAD lesion is 80% stenosed.  Mid LAD to Dist LAD lesion is 50% stenosed.  Diagnostic Dominance: Co-dominant   Assessment & Plan   1.  ***     {Are you ordering a CV Procedure (e.g. stress test, cath, DCCV, TEE, etc)?   Press F2        :[id number]   Signed, Phill Myron. West Pugh, ANP, [place]   08/30/2022 3:14 PM      Office [phone] Fax [phone]  Notice: This dictation was prepared with Dragon dictation along with smaller phrase technology. Any transcriptional errors that result from this process are unintentional and may not be corrected upon review.

## 2022-09-02 ENCOUNTER — Ambulatory Visit: Payer: Medicaid Other | Admitting: Adult Health

## 2022-09-05 ENCOUNTER — Ambulatory Visit: Payer: Medicaid Other | Admitting: Family Medicine

## 2022-09-29 ENCOUNTER — Other Ambulatory Visit (HOSPITAL_COMMUNITY): Payer: Self-pay

## 2022-09-29 ENCOUNTER — Other Ambulatory Visit: Payer: Self-pay

## 2022-09-29 MED ORDER — BOTOX 200 UNITS IJ SOLR
INTRAMUSCULAR | 3 refills | Status: DC
  Filled 2022-09-29: qty 1, 84d supply, fill #0
  Filled 2022-09-29: qty 1, fill #0
  Filled 2022-12-19: qty 1, 84d supply, fill #1
  Filled 2023-03-17: qty 1, 84d supply, fill #2
  Filled 2023-08-08: qty 1, 84d supply, fill #3

## 2022-09-29 NOTE — Telephone Encounter (Signed)
Please send Rx to [facility].

## 2022-09-29 NOTE — Addendum Note (Signed)
Addended by: Bertram Savin on: 09/29/2022 10:54 AM   Modules accepted: Orders

## 2022-09-29 NOTE — Telephone Encounter (Signed)
Botox 200 unit Rx sent to [facility].

## 2022-09-30 ENCOUNTER — Other Ambulatory Visit (HOSPITAL_COMMUNITY): Payer: Self-pay

## 2022-10-04 ENCOUNTER — Ambulatory Visit: Payer: Medicaid Other | Admitting: Family Medicine

## 2022-10-04 VITALS — BP 144/90 | HR 74 | Ht 63.0 in | Wt 206.4 lb

## 2022-10-04 DIAGNOSIS — G43711 Chronic migraine without aura, intractable, with status migrainosus: Secondary | ICD-10-CM

## 2022-10-04 MED ORDER — ONABOTULINUMTOXINA 200 UNITS IJ SOLR
155.0000 [IU] | Freq: Once | INTRAMUSCULAR | Status: AC
  Administered 2022-10-04: 155 [IU] via INTRAMUSCULAR

## 2022-10-04 NOTE — Progress Notes (Signed)
10/04/22 ALL: Phyllis Moore returns for Botox. She was last seen 03/2022. She reports having about 20 headache days. Most are migrainous. Ibuprofen helps.   03/09/2022 ALL: Phyllis Moore returns for Botox. She continues to do well. She has 2-3 migraine days per month. Aborted with ibuprofen.   12/14/2021 ALL: Phyllis Moore returns for Botox. She is doing well. She has about 4 migraines per month. Botox helps significantly.   09/14/2021 ALL: Phyllis Moore returns for Botox. She is doing well. May have 4-5 milder migrainous headaches per month. She is able to abort headache with ibuprofen. She is doing well and without concerns.   06/14/2021 ALL: Phyllis Moore returns for Botox. This is her 4th procedure. She is doing well. She has about 5 headaches per month. Ibuprofen works well for abortive therapy. She continues to work with psychology.   03/08/2021 ALL: She continues to do well with Botox procedures. Baseline daily headaches with > 15 migraine days every month. Now, having 8-10 headaches a month. She feels that they get worse towards the last two weeks before next Botox procedure. She is working with PCP to treat insomnia. She is in with psychology following the death of her daughter.   12/13/20 ALL: This is her second Botox procedure. She does feel that Botox has helped decrease intensity of headaches. She has about 8-12 migraines per month that are usually aborted with Aleve or Ibuprofen and rest. She denies adverse effects from last Botox procedure.    Consent Form Botulism Toxin Injection For Chronic Migraine    Reviewed orally with patient, additionally signature is on file:  Botulism toxin has been approved by the Federal drug administration for treatment of chronic migraine. Botulism toxin does not cure chronic migraine and it may not be effective in some patients.  The administration of botulism toxin is accomplished by injecting a small amount of toxin into the muscles of the neck and head. Dosage must be titrated  for each individual. Any benefits resulting from botulism toxin tend to wear off after 3 months with a repeat injection required if benefit is to be maintained. Injections are usually done every 3-4 months with maximum effect peak achieved by about 2 or 3 weeks. Botulism toxin is expensive and you should be sure of what costs you will incur resulting from the injection.  The side effects of botulism toxin use for chronic migraine may include:   -Transient, and usually mild, facial weakness with facial injections  -Transient, and usually mild, head or neck weakness with head/neck injections  -Reduction or loss of forehead facial animation due to forehead muscle weakness  -Eyelid drooping  -Dry eye  -Pain at the site of injection or bruising at the site of injection  -Double vision  -Potential unknown long term risks   Contraindications: You should not have Botox if you are pregnant, nursing, allergic to albumin, have an infection, skin condition, or muscle weakness at the site of the injection, or have myasthenia gravis, Lambert-Eaton syndrome, or ALS.  It is also possible that as with any injection, there may be an allergic reaction or no effect from the medication. Reduced effectiveness after repeated injections is sometimes seen and rarely infection at the injection site may occur. All care will be taken to prevent these side effects. If therapy is given over a long time, atrophy and wasting in the muscle injected may occur. Occasionally the patient's become refractory to treatment because they develop antibodies to the toxin. In this event, therapy needs to be modified.  I have read the above information and consent to the administration of botulism toxin.    BOTOX PROCEDURE NOTE FOR MIGRAINE HEADACHE  Contraindications and precautions discussed with patient(above). Aseptic procedure was observed and patient tolerated procedure. Procedure performed by Shawnie Dapper, FNP-C.   The condition  has existed for more than 6 months, and pt does not have a diagnosis of ALS, Myasthenia Gravis or Lambert-Eaton Syndrome.  Risks and benefits of injections discussed and pt agrees to proceed with the procedure.  Written consent obtained  These injections are medically necessary. Pt  receives good benefits from these injections. These injections do not cause sedations or hallucinations which the oral therapies may cause.   Description of procedure:  The patient was placed in a sitting position. The standard protocol was used for Botox as follows, with 5 units of Botox injected at each site:  -Procerus muscle, midline injection  -Corrugator muscle, bilateral injection  -Frontalis muscle, bilateral injection, with 2 sites each side, medial injection was performed in the upper one third of the frontalis muscle, in the region vertical from the medial inferior edge of the superior orbital rim. The lateral injection was again in the upper one third of the forehead vertically above the lateral limbus of the cornea, 1.5 cm lateral to the medial injection site.  -Temporalis muscle injection, 4 sites, bilaterally. The first injection was 3 cm above the tragus of the ear, second injection site was 1.5 cm to 3 cm up from the first injection site in line with the tragus of the ear. The third injection site was 1.5-3 cm forward between the first 2 injection sites. The fourth injection site was 1.5 cm posterior to the second injection site. 5th site laterally in the temporalis  muscleat the level of the outer canthus.  -Occipitalis muscle injection, 3 sites, bilaterally. The first injection was done one half way between the occipital protuberance and the tip of the mastoid process behind the ear. The second injection site was done lateral and superior to the first, 1 fingerbreadth from the first injection. The third injection site was 1 fingerbreadth superiorly and medially from the first injection  site.  -Cervical paraspinal muscle injection, 2 sites, bilaterally. The first injection site was 1 cm from the midline of the cervical spine, 3 cm inferior to the lower border of the occipital protuberance. The second injection site was 1.5 cm superiorly and laterally to the first injection site.  -Trapezius muscle injection was performed at 3 sites, bilaterally. The first injection site was in the upper trapezius muscle halfway between the inflection point of the neck, and the acromion. The second injection site was one half way between the acromion and the first injection site. The third injection was done between the first injection site and the inflection point of the neck.   Will return for repeat injection in 3 months.   A total of 200 units of Botox was prepared, 155 units of Botox was injected as documented above, any Botox not injected was wasted. The patient tolerated the procedure well, there were no complications of the above procedure.

## 2022-10-04 NOTE — Progress Notes (Signed)
Botox-200U x 1vial Lot: [id number] Expiration: 11/2024 NDC: 4540-9811-91  Bacteriostatic 0.9% Sodium Chloride- 4mL total [id number] Expiration:04/2024 NDC: 08657-846-96  Specialty pharmacy  Witnessed by Ted Mcalpine, RMA

## 2022-12-14 ENCOUNTER — Other Ambulatory Visit (HOSPITAL_COMMUNITY): Payer: Self-pay

## 2022-12-16 ENCOUNTER — Other Ambulatory Visit (HOSPITAL_COMMUNITY): Payer: Self-pay

## 2022-12-19 ENCOUNTER — Other Ambulatory Visit (HOSPITAL_COMMUNITY): Payer: Self-pay

## 2022-12-22 ENCOUNTER — Other Ambulatory Visit (HOSPITAL_COMMUNITY): Payer: Self-pay

## 2022-12-26 ENCOUNTER — Other Ambulatory Visit: Payer: Self-pay

## 2022-12-29 ENCOUNTER — Ambulatory Visit: Payer: Medicaid Other | Admitting: Family Medicine

## 2023-01-24 ENCOUNTER — Ambulatory Visit: Payer: Medicaid Other | Admitting: Neurology

## 2023-01-24 DIAGNOSIS — G43711 Chronic migraine without aura, intractable, with status migrainosus: Secondary | ICD-10-CM

## 2023-01-24 MED ORDER — ONABOTULINUMTOXINA 200 UNITS IJ SOLR
155.0000 [IU] | Freq: Once | INTRAMUSCULAR | Status: AC
  Administered 2023-01-24: 155 [IU] via INTRAMUSCULAR

## 2023-01-24 NOTE — Progress Notes (Unsigned)
Botox- 200 units x 1 vial Lot: [id number] Expiration: 12/2024 NDC: 5643-3295-18  Bacteriostatic 0.9% Sodium Chloride- 4 mL  Lot: [id number] Expiration: 09/05/2023 NDC: 6301-6010-93  Dx: A35.573 S/P  Witnessed by Elana Alm

## 2023-01-24 NOTE — Progress Notes (Unsigned)
01/24/23 ALL: Ken returns for Botox. She was last seen 03/2022. She reports having about 20 headache days. Most are migrainous. Ibuprofen helps.   03/09/2022 ALL: Doxie returns for Botox. She continues to do well. She has 2-3 migraine days per month. Aborted with ibuprofen.   12/14/2021 ALL: Tanaisha returns for Botox. She is doing well. She has about 4 migraines per month. Botox helps significantly.   09/14/2021 ALL: Baran returns for Botox. She is doing well. May have 4-5 milder migrainous headaches per month. She is able to abort headache with ibuprofen. She is doing well and without concerns.   06/14/2021 ALL: Chasidy returns for Botox. This is her 4th procedure. She is doing well. She has about 5 headaches per month. Ibuprofen works well for abortive therapy. She continues to work with psychology.   03/08/2021 ALL: She continues to do well with Botox procedures. Baseline daily headaches with > 15 migraine days every month. Now, having 8-10 headaches a month. She feels that they get worse towards the last two weeks before next Botox procedure. She is working with PCP to treat insomnia. She is in with psychology following the death of her daughter.   12/14/20 ALL: This is her second Botox procedure. She does feel that Botox has helped decrease intensity of headaches. She has about 8-12 migraines per month that are usually aborted with Aleve or Ibuprofen and rest. She denies adverse effects from last Botox procedure.    Consent Form Botulism Toxin Injection For Chronic Migraine    Reviewed orally with patient, additionally signature is on file:  Botulism toxin has been approved by the Federal drug administration for treatment of chronic migraine. Botulism toxin does not cure chronic migraine and it may not be effective in some patients.  The administration of botulism toxin is accomplished by injecting a small amount of toxin into the muscles of the neck and head. Dosage must be titrated  for each individual. Any benefits resulting from botulism toxin tend to wear off after 3 months with a repeat injection required if benefit is to be maintained. Injections are usually done every 3-4 months with maximum effect peak achieved by about 2 or 3 weeks. Botulism toxin is expensive and you should be sure of what costs you will incur resulting from the injection.  The side effects of botulism toxin use for chronic migraine may include:   -Transient, and usually mild, facial weakness with facial injections  -Transient, and usually mild, head or neck weakness with head/neck injections  -Reduction or loss of forehead facial animation due to forehead muscle weakness  -Eyelid drooping  -Dry eye  -Pain at the site of injection or bruising at the site of injection  -Double vision  -Potential unknown long term risks   Contraindications: You should not have Botox if you are pregnant, nursing, allergic to albumin, have an infection, skin condition, or muscle weakness at the site of the injection, or have myasthenia gravis, Lambert-Eaton syndrome, or ALS.  It is also possible that as with any injection, there may be an allergic reaction or no effect from the medication. Reduced effectiveness after repeated injections is sometimes seen and rarely infection at the injection site may occur. All care will be taken to prevent these side effects. If therapy is given over a long time, atrophy and wasting in the muscle injected may occur. Occasionally the patient's become refractory to treatment because they develop antibodies to the toxin. In this event, therapy needs to be modified.  I have read the above information and consent to the administration of botulism toxin.    BOTOX PROCEDURE NOTE FOR MIGRAINE HEADACHE  Contraindications and precautions discussed with patient(above). Aseptic procedure was observed and patient tolerated procedure. Procedure performed by Shawnie Dapper, FNP-C.   The condition  has existed for more than 6 months, and pt does not have a diagnosis of ALS, Myasthenia Gravis or Lambert-Eaton Syndrome.  Risks and benefits of injections discussed and pt agrees to proceed with the procedure.  Written consent obtained  These injections are medically necessary. Pt  receives good benefits from these injections. These injections do not cause sedations or hallucinations which the oral therapies may cause.   Description of procedure:  The patient was placed in a sitting position. The standard protocol was used for Botox as follows, with 5 units of Botox injected at each site:  -Procerus muscle, midline injection  -Corrugator muscle, bilateral injection  -Frontalis muscle, bilateral injection, with 2 sites each side, medial injection was performed in the upper one third of the frontalis muscle, in the region vertical from the medial inferior edge of the superior orbital rim. The lateral injection was again in the upper one third of the forehead vertically above the lateral limbus of the cornea, 1.5 cm lateral to the medial injection site.  -Temporalis muscle injection, 4 sites, bilaterally. The first injection was 3 cm above the tragus of the ear, second injection site was 1.5 cm to 3 cm up from the first injection site in line with the tragus of the ear. The third injection site was 1.5-3 cm forward between the first 2 injection sites. The fourth injection site was 1.5 cm posterior to the second injection site. 5th site laterally in the temporalis  muscleat the level of the outer canthus.  -Occipitalis muscle injection, 3 sites, bilaterally. The first injection was done one half way between the occipital protuberance and the tip of the mastoid process behind the ear. The second injection site was done lateral and superior to the first, 1 fingerbreadth from the first injection. The third injection site was 1 fingerbreadth superiorly and medially from the first injection  site.  -Cervical paraspinal muscle injection, 2 sites, bilaterally. The first injection site was 1 cm from the midline of the cervical spine, 3 cm inferior to the lower border of the occipital protuberance. The second injection site was 1.5 cm superiorly and laterally to the first injection site.  -Trapezius muscle injection was performed at 3 sites, bilaterally. The first injection site was in the upper trapezius muscle halfway between the inflection point of the neck, and the acromion. The second injection site was one half way between the acromion and the first injection site. The third injection was done between the first injection site and the inflection point of the neck.   Will return for repeat injection in 3 months.   A total of 200 units of Botox was prepared, 155 units of Botox was injected as documented above, any Botox not injected was wasted. The patient tolerated the procedure well, there were no complications of the above procedure.

## 2023-03-15 ENCOUNTER — Other Ambulatory Visit (HOSPITAL_COMMUNITY): Payer: Self-pay

## 2023-03-16 ENCOUNTER — Other Ambulatory Visit (HOSPITAL_COMMUNITY): Payer: Self-pay

## 2023-03-17 ENCOUNTER — Other Ambulatory Visit (HOSPITAL_COMMUNITY): Payer: Self-pay

## 2023-03-17 ENCOUNTER — Other Ambulatory Visit: Payer: Self-pay

## 2023-03-17 NOTE — Progress Notes (Signed)
Specialty Pharmacy Refill Coordination Note  Phyllis Moore is a 62 y.o. female contacted today regarding refills of specialty medication(s) Onabotulinumtoxina   Patient requested Delivery   Delivery date: 04/11/23   Verified address: [facility] [address] [place]   Medication will be filled on 04/10/23.

## 2023-04-07 ENCOUNTER — Other Ambulatory Visit (HOSPITAL_COMMUNITY): Payer: Self-pay

## 2023-04-10 ENCOUNTER — Other Ambulatory Visit: Payer: Self-pay

## 2023-04-12 ENCOUNTER — Other Ambulatory Visit: Payer: Self-pay

## 2023-04-26 NOTE — Progress Notes (Deleted)
04/26/23 ALL: Brie returns for Botox. Last procedure with Dr Lucia Gaskins 01/24/2023.   10/04/2022 ALL: Doristine Counter returns for Botox. She was last seen 03/2022. She reports having about 20 headache days. Most are migrainous. Ibuprofen helps.   03/09/2022 ALL: Tamye returns for Botox. She continues to do well. She has 2-3 migraine days per month. Aborted with ibuprofen.   12/14/2021 ALL: Dova returns for Botox. She is doing well. She has about 4 migraines per month. Botox helps significantly.   09/14/2021 ALL: Zahniya returns for Botox. She is doing well. May have 4-5 milder migrainous headaches per month. She is able to abort headache with ibuprofen. She is doing well and without concerns.   06/14/2021 ALL: Mashelle returns for Botox. This is her 4th procedure. She is doing well. She has about 5 headaches per month. Ibuprofen works well for abortive therapy. She continues to work with psychology.   03/08/2021 ALL: She continues to do well with Botox procedures. Baseline daily headaches with > 15 migraine days every month. Now, having 8-10 headaches a month. She feels that they get worse towards the last two weeks before next Botox procedure. She is working with PCP to treat insomnia. She is in with psychology following the death of her daughter.   12/19/20 ALL: This is her second Botox procedure. She does feel that Botox has helped decrease intensity of headaches. She has about 8-12 migraines per month that are usually aborted with Aleve or Ibuprofen and rest. She denies adverse effects from last Botox procedure.    Consent Form Botulism Toxin Injection For Chronic Migraine    Reviewed orally with patient, additionally signature is on file:  Botulism toxin has been approved by the Federal drug administration for treatment of chronic migraine. Botulism toxin does not cure chronic migraine and it may not be effective in some patients.  The administration of botulism toxin is accomplished by injecting a  small amount of toxin into the muscles of the neck and head. Dosage must be titrated for each individual. Any benefits resulting from botulism toxin tend to wear off after 3 months with a repeat injection required if benefit is to be maintained. Injections are usually done every 3-4 months with maximum effect peak achieved by about 2 or 3 weeks. Botulism toxin is expensive and you should be sure of what costs you will incur resulting from the injection.  The side effects of botulism toxin use for chronic migraine may include:   -Transient, and usually mild, facial weakness with facial injections  -Transient, and usually mild, head or neck weakness with head/neck injections  -Reduction or loss of forehead facial animation due to forehead muscle weakness  -Eyelid drooping  -Dry eye  -Pain at the site of injection or bruising at the site of injection  -Double vision  -Potential unknown long term risks   Contraindications: You should not have Botox if you are pregnant, nursing, allergic to albumin, have an infection, skin condition, or muscle weakness at the site of the injection, or have myasthenia gravis, Lambert-Eaton syndrome, or ALS.  It is also possible that as with any injection, there may be an allergic reaction or no effect from the medication. Reduced effectiveness after repeated injections is sometimes seen and rarely infection at the injection site may occur. All care will be taken to prevent these side effects. If therapy is given over a long time, atrophy and wasting in the muscle injected may occur. Occasionally the patient's become refractory to treatment because  they develop antibodies to the toxin. In this event, therapy needs to be modified.  I have read the above information and consent to the administration of botulism toxin.    BOTOX PROCEDURE NOTE FOR MIGRAINE HEADACHE  Contraindications and precautions discussed with patient(above). Aseptic procedure was observed and  patient tolerated procedure. Procedure performed by Shawnie Dapper, FNP-C.   The condition has existed for more than 6 months, and pt does not have a diagnosis of ALS, Myasthenia Gravis or Lambert-Eaton Syndrome.  Risks and benefits of injections discussed and pt agrees to proceed with the procedure.  Written consent obtained  These injections are medically necessary. Pt  receives good benefits from these injections. These injections do not cause sedations or hallucinations which the oral therapies may cause.   Description of procedure:  The patient was placed in a sitting position. The standard protocol was used for Botox as follows, with 5 units of Botox injected at each site:  -Procerus muscle, midline injection  -Corrugator muscle, bilateral injection  -Frontalis muscle, bilateral injection, with 2 sites each side, medial injection was performed in the upper one third of the frontalis muscle, in the region vertical from the medial inferior edge of the superior orbital rim. The lateral injection was again in the upper one third of the forehead vertically above the lateral limbus of the cornea, 1.5 cm lateral to the medial injection site.  -Temporalis muscle injection, 4 sites, bilaterally. The first injection was 3 cm above the tragus of the ear, second injection site was 1.5 cm to 3 cm up from the first injection site in line with the tragus of the ear. The third injection site was 1.5-3 cm forward between the first 2 injection sites. The fourth injection site was 1.5 cm posterior to the second injection site. 5th site laterally in the temporalis  muscleat the level of the outer canthus.  -Occipitalis muscle injection, 3 sites, bilaterally. The first injection was done one half way between the occipital protuberance and the tip of the mastoid process behind the ear. The second injection site was done lateral and superior to the first, 1 fingerbreadth from the first injection. The third injection  site was 1 fingerbreadth superiorly and medially from the first injection site.  -Cervical paraspinal muscle injection, 2 sites, bilaterally. The first injection site was 1 cm from the midline of the cervical spine, 3 cm inferior to the lower border of the occipital protuberance. The second injection site was 1.5 cm superiorly and laterally to the first injection site.  -Trapezius muscle injection was performed at 3 sites, bilaterally. The first injection site was in the upper trapezius muscle halfway between the inflection point of the neck, and the acromion. The second injection site was one half way between the acromion and the first injection site. The third injection was done between the first injection site and the inflection point of the neck.   Will return for repeat injection in 3 months.   A total of 200 units of Botox was prepared, 155 units of Botox was injected as documented above, any Botox not injected was wasted. The patient tolerated the procedure well, there were no complications of the above procedure.

## 2023-05-02 ENCOUNTER — Ambulatory Visit: Payer: Medicaid Other | Admitting: Neurology

## 2023-05-02 ENCOUNTER — Ambulatory Visit: Payer: Medicaid Other | Admitting: Family Medicine

## 2023-05-02 DIAGNOSIS — G43711 Chronic migraine without aura, intractable, with status migrainosus: Secondary | ICD-10-CM

## 2023-05-09 ENCOUNTER — Ambulatory Visit: Payer: Medicaid Other | Admitting: Family Medicine

## 2023-05-16 NOTE — Progress Notes (Unsigned)
05/16/23 ALL: Phyllis Moore returns for Botox. Last procedure with Dr Lucia Gaskins 01/24/2023.   10/04/2022 ALL: Phyllis Moore returns for Botox. She was last seen 03/2022. She reports having about 20 headache days. Most are migrainous. Ibuprofen helps.   03/09/2022 ALL: Phyllis Moore returns for Botox. She continues to do well. She has 2-3 migraine days per month. Aborted with ibuprofen.   12/14/2021 ALL: Phyllis Moore returns for Botox. She is doing well. She has about 4 migraines per month. Botox helps significantly.   09/14/2021 ALL: Phyllis Moore returns for Botox. She is doing well. May have 4-5 milder migrainous headaches per month. She is able to abort headache with ibuprofen. She is doing well and without concerns.   06/14/2021 ALL: Phyllis Moore returns for Botox. This is her 4th procedure. She is doing well. She has about 5 headaches per month. Ibuprofen works well for abortive therapy. She continues to work with psychology.   03/08/2021 ALL: She continues to do well with Botox procedures. Baseline daily headaches with > 15 migraine days every month. Now, having 8-10 headaches a month. She feels that they get worse towards the last two weeks before next Botox procedure. She is working with PCP to treat insomnia. She is in with psychology following the death of her daughter.   15-Dec-2020 ALL: This is her second Botox procedure. She does feel that Botox has helped decrease intensity of headaches. She has about 8-12 migraines per month that are usually aborted with Aleve or Ibuprofen and rest. She denies adverse effects from last Botox procedure.    Consent Form Botulism Toxin Injection For Chronic Migraine    Reviewed orally with patient, additionally signature is on file:  Botulism toxin has been approved by the Federal drug administration for treatment of chronic migraine. Botulism toxin does not cure chronic migraine and it may not be effective in some patients.  The administration of botulism toxin is accomplished by injecting a  small amount of toxin into the muscles of the neck and head. Dosage must be titrated for each individual. Any benefits resulting from botulism toxin tend to wear off after 3 months with a repeat injection required if benefit is to be maintained. Injections are usually done every 3-4 months with maximum effect peak achieved by about 2 or 3 weeks. Botulism toxin is expensive and you should be sure of what costs you will incur resulting from the injection.  The side effects of botulism toxin use for chronic migraine may include:   -Transient, and usually mild, facial weakness with facial injections  -Transient, and usually mild, head or neck weakness with head/neck injections  -Reduction or loss of forehead facial animation due to forehead muscle weakness  -Eyelid drooping  -Dry eye  -Pain at the site of injection or bruising at the site of injection  -Double vision  -Potential unknown long term risks   Contraindications: You should not have Botox if you are pregnant, nursing, allergic to albumin, have an infection, skin condition, or muscle weakness at the site of the injection, or have myasthenia gravis, Lambert-Eaton syndrome, or ALS.  It is also possible that as with any injection, there may be an allergic reaction or no effect from the medication. Reduced effectiveness after repeated injections is sometimes seen and rarely infection at the injection site may occur. All care will be taken to prevent these side effects. If therapy is given over a long time, atrophy and wasting in the muscle injected may occur. Occasionally the patient's become refractory to treatment because  they develop antibodies to the toxin. In this event, therapy needs to be modified.  I have read the above information and consent to the administration of botulism toxin.    BOTOX PROCEDURE NOTE FOR MIGRAINE HEADACHE  Contraindications and precautions discussed with patient(above). Aseptic procedure was observed and  patient tolerated procedure. Procedure performed by Shawnie Dapper, FNP-C.   The condition has existed for more than 6 months, and pt does not have a diagnosis of ALS, Myasthenia Gravis or Lambert-Eaton Syndrome.  Risks and benefits of injections discussed and pt agrees to proceed with the procedure.  Written consent obtained  These injections are medically necessary. Pt  receives good benefits from these injections. These injections do not cause sedations or hallucinations which the oral therapies may cause.   Description of procedure:  The patient was placed in a sitting position. The standard protocol was used for Botox as follows, with 5 units of Botox injected at each site:  -Procerus muscle, midline injection  -Corrugator muscle, bilateral injection  -Frontalis muscle, bilateral injection, with 2 sites each side, medial injection was performed in the upper one third of the frontalis muscle, in the region vertical from the medial inferior edge of the superior orbital rim. The lateral injection was again in the upper one third of the forehead vertically above the lateral limbus of the cornea, 1.5 cm lateral to the medial injection site.  -Temporalis muscle injection, 4 sites, bilaterally. The first injection was 3 cm above the tragus of the ear, second injection site was 1.5 cm to 3 cm up from the first injection site in line with the tragus of the ear. The third injection site was 1.5-3 cm forward between the first 2 injection sites. The fourth injection site was 1.5 cm posterior to the second injection site. 5th site laterally in the temporalis  muscleat the level of the outer canthus.  -Occipitalis muscle injection, 3 sites, bilaterally. The first injection was done one half way between the occipital protuberance and the tip of the mastoid process behind the ear. The second injection site was done lateral and superior to the first, 1 fingerbreadth from the first injection. The third injection  site was 1 fingerbreadth superiorly and medially from the first injection site.  -Cervical paraspinal muscle injection, 2 sites, bilaterally. The first injection site was 1 cm from the midline of the cervical spine, 3 cm inferior to the lower border of the occipital protuberance. The second injection site was 1.5 cm superiorly and laterally to the first injection site.  -Trapezius muscle injection was performed at 3 sites, bilaterally. The first injection site was in the upper trapezius muscle halfway between the inflection point of the neck, and the acromion. The second injection site was one half way between the acromion and the first injection site. The third injection was done between the first injection site and the inflection point of the neck.   Will return for repeat injection in 3 months.   A total of 200 units of Botox was prepared, 155 units of Botox was injected as documented above, any Botox not injected was wasted. The patient tolerated the procedure well, there were no complications of the above procedure.

## 2023-05-17 ENCOUNTER — Ambulatory Visit: Payer: Medicaid Other | Admitting: Family Medicine

## 2023-05-17 ENCOUNTER — Encounter: Payer: Self-pay | Admitting: Family Medicine

## 2023-05-22 ENCOUNTER — Ambulatory Visit: Payer: Medicaid Other | Admitting: Family Medicine

## 2023-05-22 NOTE — Progress Notes (Signed)
05/23/23 ALL: Phyllis Moore returns for Botox. Last procedure with Dr Lucia Gaskins 01/24/2023. She reports doing well. She averages about 4-5 headache days per month since last visit.   10/04/2022 ALL: Phyllis Moore returns for Botox. She was last seen 03/2022. She reports having about 20 headache days. Most are migrainous. Ibuprofen helps.   03/09/2022 ALL: Phyllis Moore returns for Botox. She continues to do well. She has 2-3 migraine days per month. Aborted with ibuprofen.   12/14/2021 ALL: Phyllis Moore returns for Botox. She is doing well. She has about 4 migraines per month. Botox helps significantly.   09/14/2021 ALL: Phyllis Moore returns for Botox. She is doing well. May have 4-5 milder migrainous headaches per month. She is able to abort headache with ibuprofen. She is doing well and without concerns.   06/14/2021 ALL: Phyllis Moore returns for Botox. This is her 4th procedure. She is doing well. She has about 5 headaches per month. Ibuprofen works well for abortive therapy. She continues to work with psychology.   03/08/2021 ALL: She continues to do well with Botox procedures. Baseline daily headaches with > 15 migraine days every month. Now, having 8-10 headaches a month. She feels that they get worse towards the last two weeks before next Botox procedure. She is working with PCP to treat insomnia. She is in with psychology following the death of her daughter.   2020-11-30 ALL: This is her second Botox procedure. She does feel that Botox has helped decrease intensity of headaches. She has about 8-12 migraines per month that are usually aborted with Aleve or Ibuprofen and rest. She denies adverse effects from last Botox procedure.    Consent Form Botulism Toxin Injection For Chronic Migraine    Reviewed orally with patient, additionally signature is on file:  Botulism toxin has been approved by the Federal drug administration for treatment of chronic migraine. Botulism toxin does not cure chronic migraine and it may not be effective  in some patients.  The administration of botulism toxin is accomplished by injecting a small amount of toxin into the muscles of the neck and head. Dosage must be titrated for each individual. Any benefits resulting from botulism toxin tend to wear off after 3 months with a repeat injection required if benefit is to be maintained. Injections are usually done every 3-4 months with maximum effect peak achieved by about 2 or 3 weeks. Botulism toxin is expensive and you should be sure of what costs you will incur resulting from the injection.  The side effects of botulism toxin use for chronic migraine may include:   -Transient, and usually mild, facial weakness with facial injections  -Transient, and usually mild, head or neck weakness with head/neck injections  -Reduction or loss of forehead facial animation due to forehead muscle weakness  -Eyelid drooping  -Dry eye  -Pain at the site of injection or bruising at the site of injection  -Double vision  -Potential unknown long term risks   Contraindications: You should not have Botox if you are pregnant, nursing, allergic to albumin, have an infection, skin condition, or muscle weakness at the site of the injection, or have myasthenia gravis, Lambert-Eaton syndrome, or ALS.  It is also possible that as with any injection, there may be an allergic reaction or no effect from the medication. Reduced effectiveness after repeated injections is sometimes seen and rarely infection at the injection site may occur. All care will be taken to prevent these side effects. If therapy is given over a long time, atrophy and  wasting in the muscle injected may occur. Occasionally the patient's become refractory to treatment because they develop antibodies to the toxin. In this event, therapy needs to be modified.  I have read the above information and consent to the administration of botulism toxin.    BOTOX PROCEDURE NOTE FOR MIGRAINE  HEADACHE  Contraindications and precautions discussed with patient(above). Aseptic procedure was observed and patient tolerated procedure. Procedure performed by Shawnie Dapper, FNP-C.   The condition has existed for more than 6 months, and pt does not have a diagnosis of ALS, Myasthenia Gravis or Lambert-Eaton Syndrome.  Risks and benefits of injections discussed and pt agrees to proceed with the procedure.  Written consent obtained  These injections are medically necessary. Pt  receives good benefits from these injections. These injections do not cause sedations or hallucinations which the oral therapies may cause.   Description of procedure:  The patient was placed in a sitting position. The standard protocol was used for Botox as follows, with 5 units of Botox injected at each site:  -Procerus muscle, midline injection  -Corrugator muscle, bilateral injection  -Frontalis muscle, bilateral injection, with 2 sites each side, medial injection was performed in the upper one third of the frontalis muscle, in the region vertical from the medial inferior edge of the superior orbital rim. The lateral injection was again in the upper one third of the forehead vertically above the lateral limbus of the cornea, 1.5 cm lateral to the medial injection site.  -Temporalis muscle injection, 4 sites, bilaterally. The first injection was 3 cm above the tragus of the ear, second injection site was 1.5 cm to 3 cm up from the first injection site in line with the tragus of the ear. The third injection site was 1.5-3 cm forward between the first 2 injection sites. The fourth injection site was 1.5 cm posterior to the second injection site. 5th site laterally in the temporalis  muscleat the level of the outer canthus.  -Occipitalis muscle injection, 3 sites, bilaterally. The first injection was done one half way between the occipital protuberance and the tip of the mastoid process behind the ear. The second injection  site was done lateral and superior to the first, 1 fingerbreadth from the first injection. The third injection site was 1 fingerbreadth superiorly and medially from the first injection site.  -Cervical paraspinal muscle injection, 2 sites, bilaterally. The first injection site was 1 cm from the midline of the cervical spine, 3 cm inferior to the lower border of the occipital protuberance. The second injection site was 1.5 cm superiorly and laterally to the first injection site.  -Trapezius muscle injection was performed at 3 sites, bilaterally. The first injection site was in the upper trapezius muscle halfway between the inflection point of the neck, and the acromion. The second injection site was one half way between the acromion and the first injection site. The third injection was done between the first injection site and the inflection point of the neck.   Will return for repeat injection in 3 months.   A total of 200 units of Botox was prepared, 155 units of Botox was injected as documented above, any Botox not injected was wasted. The patient tolerated the procedure well, there were no complications of the above procedure.

## 2023-05-23 ENCOUNTER — Ambulatory Visit: Payer: Medicaid Other | Admitting: Family Medicine

## 2023-05-23 ENCOUNTER — Encounter: Payer: Self-pay | Admitting: Family Medicine

## 2023-05-23 DIAGNOSIS — G43711 Chronic migraine without aura, intractable, with status migrainosus: Secondary | ICD-10-CM

## 2023-05-23 MED ORDER — ONABOTULINUMTOXINA 200 UNITS IJ SOLR
155.0000 [IU] | Freq: Once | INTRAMUSCULAR | Status: AC
  Administered 2023-05-23: 155 [IU] via INTRAMUSCULAR

## 2023-05-23 NOTE — Progress Notes (Signed)
Botox- 200 units x 1 vial Lot: [id number] Expiration: 08/2025 NDC: 2952-8413-24  Bacteriostatic 0.9% Sodium Chloride- * mL  Lot: [id number] Expiration: 04/06/2024 NDC: 2536-6440-34  Dx: V42.595 S/P  Witnessed by Ted Mcalpine, RMA

## 2023-08-04 ENCOUNTER — Other Ambulatory Visit: Payer: Self-pay

## 2023-08-07 ENCOUNTER — Other Ambulatory Visit (HOSPITAL_COMMUNITY): Payer: Self-pay

## 2023-08-08 ENCOUNTER — Other Ambulatory Visit (HOSPITAL_COMMUNITY): Payer: Self-pay

## 2023-08-08 NOTE — Progress Notes (Signed)
 Specialty Pharmacy Refill Coordination Note  Anjenette Gerbino is a 63 y.o. female contacted today regarding refills of specialty medication(s) OnabotulinumtoxinA (Botox)   Patient requested Courier to Provider Office   Delivery date: 08/10/23   Verified address: [facility]- [address], [address], [place], [place] [address]   Medication will be filled on 08/09/23.

## 2023-08-14 ENCOUNTER — Ambulatory Visit: Payer: Medicaid Other | Admitting: Family Medicine

## 2023-08-15 NOTE — Progress Notes (Unsigned)
 08/16/23 ALL: Phyllis Moore returns for Botox. She continues to do well. Migraines are well managed.   05/23/2023 ALL: Phyllis Moore returns for Botox. Last procedure with Dr Lucia Gaskins 01/24/2023. She reports doing well. She averages about 4-5 headache days per month since last visit.   10/04/2022 ALL: Phyllis Moore returns for Botox. She was last seen 03/2022. She reports having about 20 headache days. Most are migrainous. Ibuprofen helps.   03/09/2022 ALL: Phyllis Moore returns for Botox. She continues to do well. She has 2-3 migraine days per month. Aborted with ibuprofen.   12/14/2021 ALL: Phyllis Moore returns for Botox. She is doing well. She has about 4 migraines per month. Botox helps significantly.   09/14/2021 ALL: Phyllis Moore returns for Botox. She is doing well. May have 4-5 milder migrainous headaches per month. She is able to abort headache with ibuprofen. She is doing well and without concerns.   06/14/2021 ALL: Phyllis Moore returns for Botox. This is her 4th procedure. She is doing well. She has about 5 headaches per month. Ibuprofen works well for abortive therapy. She continues to work with psychology.   03/08/2021 ALL: She continues to do well with Botox procedures. Baseline daily headaches with > 15 migraine days every month. Now, having 8-10 headaches a month. She feels that they get worse towards the last two weeks before next Botox procedure. She is working with PCP to treat insomnia. She is in with psychology following the death of her daughter.   12-17-2020 ALL: This is her second Botox procedure. She does feel that Botox has helped decrease intensity of headaches. She has about 8-12 migraines per month that are usually aborted with Aleve or Ibuprofen and rest. She denies adverse effects from last Botox procedure.    Consent Form Botulism Toxin Injection For Chronic Migraine    Reviewed orally with patient, additionally signature is on file:  Botulism toxin has been approved by the Federal drug administration for  treatment of chronic migraine. Botulism toxin does not cure chronic migraine and it may not be effective in some patients.  The administration of botulism toxin is accomplished by injecting a small amount of toxin into the muscles of the neck and head. Dosage must be titrated for each individual. Any benefits resulting from botulism toxin tend to wear off after 3 months with a repeat injection required if benefit is to be maintained. Injections are usually done every 3-4 months with maximum effect peak achieved by about 2 or 3 weeks. Botulism toxin is expensive and you should be sure of what costs you will incur resulting from the injection.  The side effects of botulism toxin use for chronic migraine may include:   -Transient, and usually mild, facial weakness with facial injections  -Transient, and usually mild, head or neck weakness with head/neck injections  -Reduction or loss of forehead facial animation due to forehead muscle weakness  -Eyelid drooping  -Dry eye  -Pain at the site of injection or bruising at the site of injection  -Double vision  -Potential unknown long term risks   Contraindications: You should not have Botox if you are pregnant, nursing, allergic to albumin, have an infection, skin condition, or muscle weakness at the site of the injection, or have myasthenia gravis, Lambert-Eaton syndrome, or ALS.  It is also possible that as with any injection, there may be an allergic reaction or no effect from the medication. Reduced effectiveness after repeated injections is sometimes seen and rarely infection at the injection site may occur. All care will  be taken to prevent these side effects. If therapy is given over a long time, atrophy and wasting in the muscle injected may occur. Occasionally the patient's become refractory to treatment because they develop antibodies to the toxin. In this event, therapy needs to be modified.  I have read the above information and consent to  the administration of botulism toxin.    BOTOX PROCEDURE NOTE FOR MIGRAINE HEADACHE  Contraindications and precautions discussed with patient(above). Aseptic procedure was observed and patient tolerated procedure. Procedure performed by Shawnie Dapper, FNP-C.   The condition has existed for more than 6 months, and pt does not have a diagnosis of ALS, Myasthenia Gravis or Lambert-Eaton Syndrome.  Risks and benefits of injections discussed and pt agrees to proceed with the procedure.  Written consent obtained  These injections are medically necessary. Pt  receives good benefits from these injections. These injections do not cause sedations or hallucinations which the oral therapies may cause.   Description of procedure:  The patient was placed in a sitting position. The standard protocol was used for Botox as follows, with 5 units of Botox injected at each site:  -Procerus muscle, midline injection  -Corrugator muscle, bilateral injection  -Frontalis muscle, bilateral injection, with 2 sites each side, medial injection was performed in the upper one third of the frontalis muscle, in the region vertical from the medial inferior edge of the superior orbital rim. The lateral injection was again in the upper one third of the forehead vertically above the lateral limbus of the cornea, 1.5 cm lateral to the medial injection site.  -Temporalis muscle injection, 4 sites, bilaterally. The first injection was 3 cm above the tragus of the ear, second injection site was 1.5 cm to 3 cm up from the first injection site in line with the tragus of the ear. The third injection site was 1.5-3 cm forward between the first 2 injection sites. The fourth injection site was 1.5 cm posterior to the second injection site. 5th site laterally in the temporalis  muscleat the level of the outer canthus.  -Occipitalis muscle injection, 3 sites, bilaterally. The first injection was done one half way between the occipital  protuberance and the tip of the mastoid process behind the ear. The second injection site was done lateral and superior to the first, 1 fingerbreadth from the first injection. The third injection site was 1 fingerbreadth superiorly and medially from the first injection site.  -Cervical paraspinal muscle injection, 2 sites, bilaterally. The first injection site was 1 cm from the midline of the cervical spine, 3 cm inferior to the lower border of the occipital protuberance. The second injection site was 1.5 cm superiorly and laterally to the first injection site.  -Trapezius muscle injection was performed at 3 sites, bilaterally. The first injection site was in the upper trapezius muscle halfway between the inflection point of the neck, and the acromion. The second injection site was one half way between the acromion and the first injection site. The third injection was done between the first injection site and the inflection point of the neck.   Will return for repeat injection in 3 months.   A total of 200 units of Botox was prepared, 155 units of Botox was injected as documented above, any Botox not injected was wasted. The patient tolerated the procedure well, there were no complications of the above procedure.

## 2023-08-16 ENCOUNTER — Ambulatory Visit: Payer: Medicaid Other | Admitting: Family Medicine

## 2023-08-16 DIAGNOSIS — G43711 Chronic migraine without aura, intractable, with status migrainosus: Secondary | ICD-10-CM | POA: Diagnosis not present

## 2023-08-16 MED ORDER — ONABOTULINUMTOXINA 200 UNITS IJ SOLR
155.0000 [IU] | Freq: Once | INTRAMUSCULAR | Status: AC
  Administered 2023-08-16: 155 [IU] via INTRAMUSCULAR

## 2023-08-16 NOTE — Progress Notes (Signed)
 Botox- 200 units x 1 vial Lot: [id number] Expiration: 2027/04 NDC: 0023-3921-02  Bacteriostatic 0.9% Sodium Chloride- 4mL  Lot: [id number] Expiration: 11/25 NDC: 24401-027-25  Dx:  D66.440  S/P  Witnessed by Dione Plover RN

## 2023-10-17 ENCOUNTER — Telehealth: Payer: Self-pay | Admitting: Family Medicine

## 2023-10-17 NOTE — Telephone Encounter (Signed)
 Submitted auth renewal via CMM, received instant approval. Pt will continue to fill through [facility]. Auth#: [id number] (10/17/23-10/16/24)

## 2023-10-18 ENCOUNTER — Other Ambulatory Visit: Payer: Self-pay | Admitting: Pharmacy Technician

## 2023-10-18 ENCOUNTER — Other Ambulatory Visit: Payer: Self-pay

## 2023-10-18 ENCOUNTER — Other Ambulatory Visit: Payer: Self-pay | Admitting: Family Medicine

## 2023-10-18 MED ORDER — BOTOX 200 UNITS IJ SOLR
INTRAMUSCULAR | 3 refills | Status: AC
  Filled 2023-10-18: qty 1, 90d supply, fill #0
  Filled 2024-02-06: qty 1, 90d supply, fill #1
  Filled 2024-04-30: qty 1, 90d supply, fill #2

## 2023-10-18 NOTE — Telephone Encounter (Signed)
 Last seen on 08/16/23 Follow up scheduled 11/15/23

## 2023-10-18 NOTE — Progress Notes (Signed)
 Specialty Pharmacy Refill Coordination Note  Phyllis Moore is a 63 y.o. female contacted today regarding refills of specialty medication(s) OnabotulinumtoxinA  (Botox )   Patient requested Courier to Provider Office   Delivery date: 11/08/23   Verified address: [facility] [address], [place], [place] [address]   Medication will be filled on 11/07/23.   This fill date is pending response to refill request from provider. Patient is aware and if they have not received fill by intended date they must follow up with pharmacy.

## 2023-11-07 ENCOUNTER — Other Ambulatory Visit: Payer: Self-pay

## 2023-11-09 ENCOUNTER — Other Ambulatory Visit: Payer: Self-pay

## 2023-11-13 ENCOUNTER — Other Ambulatory Visit: Payer: Self-pay

## 2023-11-14 ENCOUNTER — Other Ambulatory Visit (HOSPITAL_COMMUNITY): Payer: Self-pay

## 2023-11-14 ENCOUNTER — Other Ambulatory Visit: Payer: Self-pay

## 2023-11-14 NOTE — Progress Notes (Deleted)
 11/14/23 ALL: Phyllis Moore returns for Botox . She continues to do well. Migraines are well managed.   08/16/2023 ALL: Phyllis Moore returns for Botox . She continues to do well. Migraines are well managed.   05/23/2023 ALL: Phyllis Moore returns for Botox . Last procedure with Dr Tresia Fruit 01/24/2023. She reports doing well. She averages about 4-5 headache days per month since last visit.   10/04/2022 ALL: Phyllis Moore returns for Botox . She was last seen 03/2022. She reports having about 20 headache days. Most are migrainous. Ibuprofen  helps.   03/09/2022 ALL: Phyllis Moore returns for Botox . She continues to do well. She has 2-3 migraine days per month. Aborted with ibuprofen .   12/14/2021 ALL: Phyllis Moore returns for Botox . She is doing well. She has about 4 migraines per month. Botox  helps significantly.   09/14/2021 ALL: Phyllis Moore returns for Botox . She is doing well. May have 4-5 milder migrainous headaches per month. She is able to abort headache with ibuprofen . She is doing well and without concerns.   06/14/2021 ALL: Phyllis Moore returns for Botox . This is her 4th procedure. She is doing well. She has about 5 headaches per month. Ibuprofen  works well for abortive therapy. She continues to work with psychology.   03/08/2021 ALL: She continues to do well with Botox  procedures. Baseline daily headaches with > 15 migraine days every month. Now, having 8-10 headaches a month. She feels that they get worse towards the last two weeks before next Botox  procedure. She is working with PCP to treat insomnia. She is in with psychology following the death of her daughter.   12-26-2020 ALL: This is her second Botox  procedure. She does feel that Botox  has helped decrease intensity of headaches. She has about 8-12 migraines per month that are usually aborted with Aleve  or Ibuprofen  and rest. She denies adverse effects from last Botox  procedure.    Consent Form Botulism Toxin Injection For Chronic Migraine    Reviewed orally with patient, additionally  signature is on file:  Botulism toxin has been approved by the Federal drug administration for treatment of chronic migraine. Botulism toxin does not cure chronic migraine and it may not be effective in some patients.  The administration of botulism toxin is accomplished by injecting a small amount of toxin into the muscles of the neck and head. Dosage must be titrated for each individual. Any benefits resulting from botulism toxin tend to wear off after 3 months with a repeat injection required if benefit is to be maintained. Injections are usually done every 3-4 months with maximum effect peak achieved by about 2 or 3 weeks. Botulism toxin is expensive and you should be sure of what costs you will incur resulting from the injection.  The side effects of botulism toxin use for chronic migraine may include:   -Transient, and usually mild, facial weakness with facial injections  -Transient, and usually mild, head or neck weakness with head/neck injections  -Reduction or loss of forehead facial animation due to forehead muscle weakness  -Eyelid drooping  -Dry eye  -Pain at the site of injection or bruising at the site of injection  -Double vision  -Potential unknown long term risks   Contraindications: You should not have Botox  if you are pregnant, nursing, allergic to albumin, have an infection, skin condition, or muscle weakness at the site of the injection, or have myasthenia gravis, Lambert-Eaton syndrome, or ALS.  It is also possible that as with any injection, there may be an allergic reaction or no effect from the medication. Reduced effectiveness after  repeated injections is sometimes seen and rarely infection at the injection site may occur. All care will be taken to prevent these side effects. If therapy is given over a long time, atrophy and wasting in the muscle injected may occur. Occasionally the patient's become refractory to treatment because they develop antibodies to the toxin. In  this event, therapy needs to be modified.  I have read the above information and consent to the administration of botulism toxin.    BOTOX  PROCEDURE NOTE FOR MIGRAINE HEADACHE  Contraindications and precautions discussed with patient(above). Aseptic procedure was observed and patient tolerated procedure. Procedure performed by Terrilyn Fick, FNP-C.   The condition has existed for more than 6 months, and pt does not have a diagnosis of ALS, Myasthenia Gravis or Lambert-Eaton Syndrome.  Risks and benefits of injections discussed and pt agrees to proceed with the procedure.  Written consent obtained  These injections are medically necessary. Pt  receives good benefits from these injections. These injections do not cause sedations or hallucinations which the oral therapies may cause.   Description of procedure:  The patient was placed in a sitting position. The standard protocol was used for Botox  as follows, with 5 units of Botox  injected at each site:  -Procerus muscle, midline injection  -Corrugator muscle, bilateral injection  -Frontalis muscle, bilateral injection, with 2 sites each side, medial injection was performed in the upper one third of the frontalis muscle, in the region vertical from the medial inferior edge of the superior orbital rim. The lateral injection was again in the upper one third of the forehead vertically above the lateral limbus of the cornea, 1.5 cm lateral to the medial injection site.  -Temporalis muscle injection, 4 sites, bilaterally. The first injection was 3 cm above the tragus of the ear, second injection site was 1.5 cm to 3 cm up from the first injection site in line with the tragus of the ear. The third injection site was 1.5-3 cm forward between the first 2 injection sites. The fourth injection site was 1.5 cm posterior to the second injection site. 5th site laterally in the temporalis  muscleat the level of the outer canthus.  -Occipitalis muscle injection,  3 sites, bilaterally. The first injection was done one half way between the occipital protuberance and the tip of the mastoid process behind the ear. The second injection site was done lateral and superior to the first, 1 fingerbreadth from the first injection. The third injection site was 1 fingerbreadth superiorly and medially from the first injection site.  -Cervical paraspinal muscle injection, 2 sites, bilaterally. The first injection site was 1 cm from the midline of the cervical spine, 3 cm inferior to the lower border of the occipital protuberance. The second injection site was 1.5 cm superiorly and laterally to the first injection site.  -Trapezius muscle injection was performed at 3 sites, bilaterally. The first injection site was in the upper trapezius muscle halfway between the inflection point of the neck, and the acromion. The second injection site was one half way between the acromion and the first injection site. The third injection was done between the first injection site and the inflection point of the neck.   Will return for repeat injection in 3 months.   A total of 200 units of Botox  was prepared, 155 units of Botox  was injected as documented above, any Botox  not injected was wasted. The patient tolerated the procedure well, there were no complications of the above procedure.

## 2023-11-15 ENCOUNTER — Telehealth: Payer: Self-pay | Admitting: Family Medicine

## 2023-11-15 ENCOUNTER — Encounter: Payer: Self-pay | Admitting: Family Medicine

## 2023-11-15 ENCOUNTER — Ambulatory Visit: Admitting: Family Medicine

## 2023-11-15 DIAGNOSIS — G43711 Chronic migraine without aura, intractable, with status migrainosus: Secondary | ICD-10-CM

## 2023-11-15 NOTE — Telephone Encounter (Signed)
 Pt call to reschedule appt . There was not any slots  available with NP OR MD for the next 2 wks  . Can you see if you can get pt a slot  within time frame .

## 2023-11-15 NOTE — Telephone Encounter (Signed)
 Will ask Amy if we can Squeeze pt in

## 2023-11-15 NOTE — Telephone Encounter (Signed)
 Spoke with Amy and patient. I offered pt 4 different appts between tomorrow and next Tues 6/17 but pt declined. She was scheduled for Mon 6/16 at 330 pm.

## 2023-11-16 NOTE — Progress Notes (Signed)
 11/20/23 ALL: Phyllis Moore returns for Botox . She continues to do well. Migraines are well managed. She may have 3-4 per month. Ibuprofen  aborts headache.   08/16/2023 ALL: Phyllis Moore returns for Botox . She continues to do well. Migraines are well managed.   05/23/2023 ALL: Phyllis Moore returns for Botox . Last procedure with Dr Tresia Fruit 01/24/2023. She reports doing well. She averages about 4-5 headache days per month since last visit.   10/04/2022 ALL: Phyllis Moore returns for Botox . She was last seen 03/2022. She reports having about 20 headache days. Most are migrainous. Ibuprofen  helps.   03/09/2022 ALL: Phyllis Moore returns for Botox . She continues to do well. She has 2-3 migraine days per month. Aborted with ibuprofen .   12/14/2021 ALL: Phyllis Moore returns for Botox . She is doing well. She has about 4 migraines per month. Botox  helps significantly.   09/14/2021 ALL: Phyllis Moore returns for Botox . She is doing well. May have 4-5 milder migrainous headaches per month. She is able to abort headache with ibuprofen . She is doing well and without concerns.   06/14/2021 ALL: Phyllis Moore returns for Botox . This is her 4th procedure. She is doing well. She has about 5 headaches per month. Ibuprofen  works well for abortive therapy. She continues to work with psychology.   03/08/2021 ALL: She continues to do well with Botox  procedures. Baseline daily headaches with > 15 migraine days every month. Now, having 8-10 headaches a month. She feels that they get worse towards the last two weeks before next Botox  procedure. She is working with PCP to treat insomnia. She is in with psychology following the death of her daughter.   12-04-20 ALL: This is her second Botox  procedure. She does feel that Botox  has helped decrease intensity of headaches. She has about 8-12 migraines per month that are usually aborted with Aleve  or Ibuprofen  and rest. She denies adverse effects from last Botox  procedure.    Consent Form Botulism Toxin Injection For Chronic  Migraine    Reviewed orally with patient, additionally signature is on file:  Botulism toxin has been approved by the Federal drug administration for treatment of chronic migraine. Botulism toxin does not cure chronic migraine and it may not be effective in some patients.  The administration of botulism toxin is accomplished by injecting a small amount of toxin into the muscles of the neck and head. Dosage must be titrated for each individual. Any benefits resulting from botulism toxin tend to wear off after 3 months with a repeat injection required if benefit is to be maintained. Injections are usually done every 3-4 months with maximum effect peak achieved by about 2 or 3 weeks. Botulism toxin is expensive and you should be sure of what costs you will incur resulting from the injection.  The side effects of botulism toxin use for chronic migraine may include:   -Transient, and usually mild, facial weakness with facial injections  -Transient, and usually mild, head or neck weakness with head/neck injections  -Reduction or loss of forehead facial animation due to forehead muscle weakness  -Eyelid drooping  -Dry eye  -Pain at the site of injection or bruising at the site of injection  -Double vision  -Potential unknown long term risks   Contraindications: You should not have Botox  if you are pregnant, nursing, allergic to albumin, have an infection, skin condition, or muscle weakness at the site of the injection, or have myasthenia gravis, Lambert-Eaton syndrome, or ALS.  It is also possible that as with any injection, there may be an allergic reaction  or no effect from the medication. Reduced effectiveness after repeated injections is sometimes seen and rarely infection at the injection site may occur. All care will be taken to prevent these side effects. If therapy is given over a long time, atrophy and wasting in the muscle injected may occur. Occasionally the patient's become refractory to  treatment because they develop antibodies to the toxin. In this event, therapy needs to be modified.  I have read the above information and consent to the administration of botulism toxin.    BOTOX  PROCEDURE NOTE FOR MIGRAINE HEADACHE  Contraindications and precautions discussed with patient(above). Aseptic procedure was observed and patient tolerated procedure. Procedure performed by Terrilyn Fick, FNP-C.   The condition has existed for more than 6 months, and pt does not have a diagnosis of ALS, Myasthenia Gravis or Lambert-Eaton Syndrome.  Risks and benefits of injections discussed and pt agrees to proceed with the procedure.  Written consent obtained  These injections are medically necessary. Pt  receives good benefits from these injections. These injections do not cause sedations or hallucinations which the oral therapies may cause.   Description of procedure:  The patient was placed in a sitting position. The standard protocol was used for Botox  as follows, with 5 units of Botox  injected at each site:  -Procerus muscle, midline injection  -Corrugator muscle, bilateral injection  -Frontalis muscle, bilateral injection, with 2 sites each side, medial injection was performed in the upper one third of the frontalis muscle, in the region vertical from the medial inferior edge of the superior orbital rim. The lateral injection was again in the upper one third of the forehead vertically above the lateral limbus of the cornea, 1.5 cm lateral to the medial injection site.  -Temporalis muscle injection, 4 sites, bilaterally. The first injection was 3 cm above the tragus of the ear, second injection site was 1.5 cm to 3 cm up from the first injection site in line with the tragus of the ear. The third injection site was 1.5-3 cm forward between the first 2 injection sites. The fourth injection site was 1.5 cm posterior to the second injection site. 5th site laterally in the temporalis  muscleat the  level of the outer canthus.  -Occipitalis muscle injection, 3 sites, bilaterally. The first injection was done one half way between the occipital protuberance and the tip of the mastoid process behind the ear. The second injection site was done lateral and superior to the first, 1 fingerbreadth from the first injection. The third injection site was 1 fingerbreadth superiorly and medially from the first injection site.  -Cervical paraspinal muscle injection, 2 sites, bilaterally. The first injection site was 1 cm from the midline of the cervical spine, 3 cm inferior to the lower border of the occipital protuberance. The second injection site was 1.5 cm superiorly and laterally to the first injection site.  -Trapezius muscle injection was performed at 3 sites, bilaterally. The first injection site was in the upper trapezius muscle halfway between the inflection point of the neck, and the acromion. The second injection site was one half way between the acromion and the first injection site. The third injection was done between the first injection site and the inflection point of the neck.   Will return for repeat injection in 3 months.   A total of 200 units of Botox  was prepared, 155 units of Botox  was injected as documented above, any Botox  not injected was wasted. The patient tolerated the procedure well, there were  no complications of the above procedure.

## 2023-11-20 ENCOUNTER — Ambulatory Visit: Admitting: Family Medicine

## 2023-11-20 ENCOUNTER — Encounter: Payer: Self-pay | Admitting: Family Medicine

## 2023-11-20 VITALS — BP 142/86 | HR 118

## 2023-11-20 DIAGNOSIS — G43711 Chronic migraine without aura, intractable, with status migrainosus: Secondary | ICD-10-CM

## 2023-11-20 MED ORDER — ONABOTULINUMTOXINA 200 UNITS IJ SOLR
155.0000 [IU] | Freq: Once | INTRAMUSCULAR | Status: AC
  Administered 2023-11-20: 155 [IU] via INTRAMUSCULAR

## 2023-11-20 NOTE — Progress Notes (Signed)
 Botox -200U x 1vial Lot: [id number] Expiration: 03/2026 NDC: 4540-9811-91  Bacteriostatic 0.9% Sodium Chloride - 4mL total [id number] Expiration: 12/03/2024 NDC: 3086-5784-69  Specialty pharmacy  Witnessed [id number] Roanne Chi, RN

## 2024-01-29 ENCOUNTER — Other Ambulatory Visit (HOSPITAL_COMMUNITY): Payer: Self-pay

## 2024-02-06 ENCOUNTER — Other Ambulatory Visit: Payer: Self-pay

## 2024-02-06 NOTE — Progress Notes (Signed)
 Specialty Pharmacy Refill Coordination Note  Staisha Winiarski is a 63 y.o. female contacted today regarding refills of specialty medication(s) OnabotulinumtoxinA  (Botox )   Patient requested Courier to Provider Office   Delivery date: 02/12/24   Verified address: [facility] [address], [place], [place] [address]   Medication will be filled on 02/09/24.

## 2024-02-08 ENCOUNTER — Other Ambulatory Visit: Payer: Self-pay

## 2024-02-13 ENCOUNTER — Telehealth: Payer: Self-pay | Admitting: Family Medicine

## 2024-02-13 NOTE — Telephone Encounter (Signed)
 Patient cancelled appointment due to having car trouble. Will call back to reschedule after car is fixed.

## 2024-02-14 ENCOUNTER — Ambulatory Visit: Admitting: Family Medicine

## 2024-04-30 ENCOUNTER — Other Ambulatory Visit: Payer: Self-pay

## 2024-05-21 ENCOUNTER — Other Ambulatory Visit: Payer: Self-pay

## 2024-05-22 ENCOUNTER — Other Ambulatory Visit: Payer: Self-pay
